# Patient Record
Sex: Male | Born: 1940 | Race: White | Hispanic: No | Marital: Married | Smoking: Former smoker
Health system: Southern US, Community
[De-identification: ages and names within clinical notes are randomized; demographics above are authoritative.]

## PROBLEM LIST (undated history)

## (undated) DIAGNOSIS — R7303 Prediabetes: Secondary | ICD-10-CM

## (undated) DIAGNOSIS — E7800 Pure hypercholesterolemia, unspecified: Secondary | ICD-10-CM

## (undated) DIAGNOSIS — I2510 Atherosclerotic heart disease of native coronary artery without angina pectoris: Secondary | ICD-10-CM

## (undated) DIAGNOSIS — I5020 Unspecified systolic (congestive) heart failure: Secondary | ICD-10-CM

## (undated) DIAGNOSIS — M1990 Unspecified osteoarthritis, unspecified site: Secondary | ICD-10-CM

## (undated) DIAGNOSIS — R1013 Epigastric pain: Secondary | ICD-10-CM

## (undated) DIAGNOSIS — I700 Atherosclerosis of aorta: Secondary | ICD-10-CM

## (undated) DIAGNOSIS — C801 Malignant (primary) neoplasm, unspecified: Secondary | ICD-10-CM

## (undated) DIAGNOSIS — I38 Endocarditis, valve unspecified: Secondary | ICD-10-CM

## (undated) DIAGNOSIS — Z889 Allergy status to unspecified drugs, medicaments and biological substances status: Secondary | ICD-10-CM

## (undated) DIAGNOSIS — N400 Enlarged prostate without lower urinary tract symptoms: Secondary | ICD-10-CM

## (undated) DIAGNOSIS — K219 Gastro-esophageal reflux disease without esophagitis: Secondary | ICD-10-CM

## (undated) DIAGNOSIS — F32A Depression, unspecified: Secondary | ICD-10-CM

## (undated) DIAGNOSIS — Z950 Presence of cardiac pacemaker: Secondary | ICD-10-CM

## (undated) DIAGNOSIS — I10 Essential (primary) hypertension: Secondary | ICD-10-CM

## (undated) DIAGNOSIS — N281 Cyst of kidney, acquired: Secondary | ICD-10-CM

## (undated) DIAGNOSIS — Z86010 Personal history of colonic polyps: Secondary | ICD-10-CM

## (undated) DIAGNOSIS — I252 Old myocardial infarction: Secondary | ICD-10-CM

## (undated) DIAGNOSIS — I499 Cardiac arrhythmia, unspecified: Secondary | ICD-10-CM

## (undated) DIAGNOSIS — Z7901 Long term (current) use of anticoagulants: Secondary | ICD-10-CM

## (undated) DIAGNOSIS — K635 Polyp of colon: Secondary | ICD-10-CM

## (undated) DIAGNOSIS — Z87442 Personal history of urinary calculi: Secondary | ICD-10-CM

## (undated) DIAGNOSIS — I4891 Unspecified atrial fibrillation: Secondary | ICD-10-CM

## (undated) DIAGNOSIS — N529 Male erectile dysfunction, unspecified: Secondary | ICD-10-CM

## (undated) DIAGNOSIS — I2721 Secondary pulmonary arterial hypertension: Secondary | ICD-10-CM

## (undated) HISTORY — PX: OTHER SURGICAL HISTORY: SHX169

## (undated) HISTORY — DX: Polyp of colon: K63.5

## (undated) HISTORY — DX: Unspecified atrial fibrillation: I48.91

---

## 2004-08-20 ENCOUNTER — Ambulatory Visit: Payer: Self-pay | Admitting: Internal Medicine

## 2004-08-20 HISTORY — PX: CARDIAC CATHETERIZATION: SHX172

## 2007-07-01 HISTORY — PX: PROSTATE SURGERY: SHX751

## 2008-10-06 ENCOUNTER — Ambulatory Visit: Payer: Self-pay | Admitting: Family Medicine

## 2008-12-21 ENCOUNTER — Ambulatory Visit: Payer: Self-pay | Admitting: Unknown Physician Specialty

## 2009-11-07 ENCOUNTER — Ambulatory Visit: Payer: Self-pay | Admitting: Urology

## 2009-11-19 ENCOUNTER — Ambulatory Visit: Payer: Self-pay | Admitting: Urology

## 2009-11-23 ENCOUNTER — Observation Stay: Payer: Self-pay | Admitting: Internal Medicine

## 2011-07-01 HISTORY — PX: COLONOSCOPY: SHX174

## 2011-07-13 IMAGING — CT CT HEAD WITHOUT CONTRAST
2 series · 16 of 30 positions shown, 20 images · non-contrast
Comparison: none

REASON FOR EXAM: SYNCOPE
COMMENTS:   May transport without cardiac monitor

PROCEDURE:     CT  - CT HEAD WITHOUT CONTRAST  - November 23, 2009  [DATE]
RESULT:
HISTORY: Syncope.

[Series 2: without · axial · non-contrast · 0.43mm/px · z∈[+623,+748]mm · 13 of 31 slices shown, 17 images]
[im 3/31  brain]
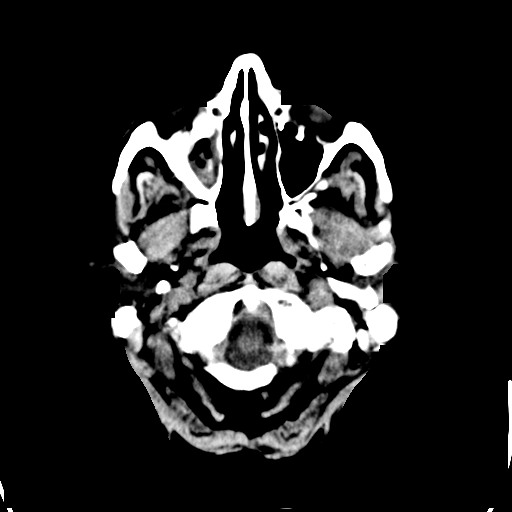
[im 3/31  bone]
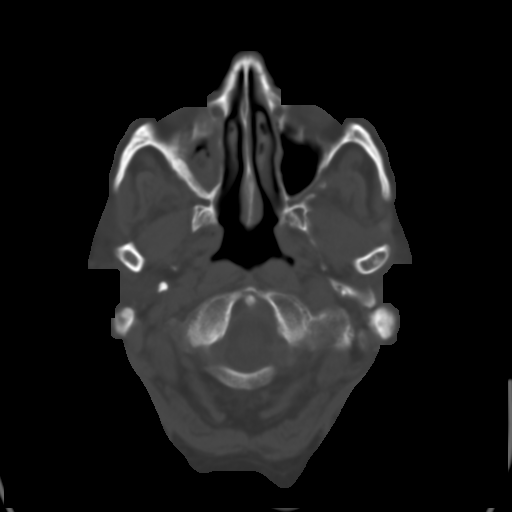
[im 5/31  brain]
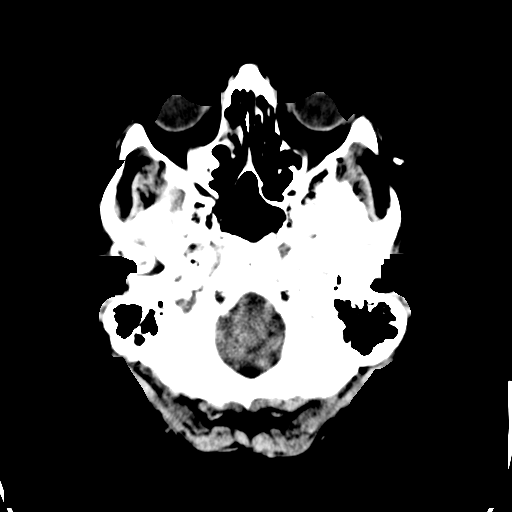
[im 7/31  brain]
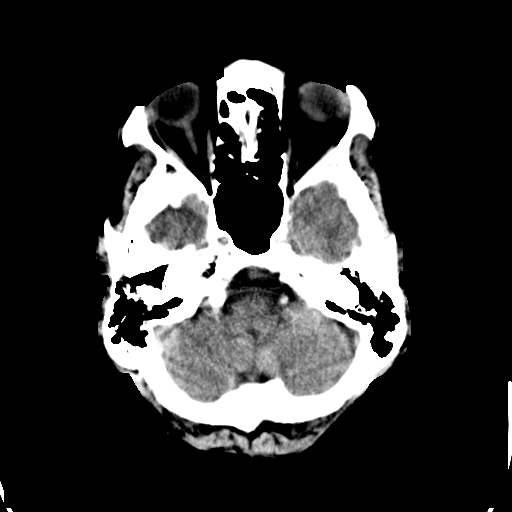
[im 9/31  brain]
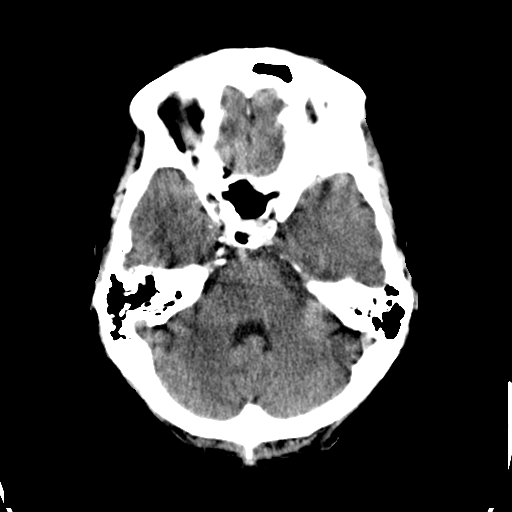
[im 11/31  brain]
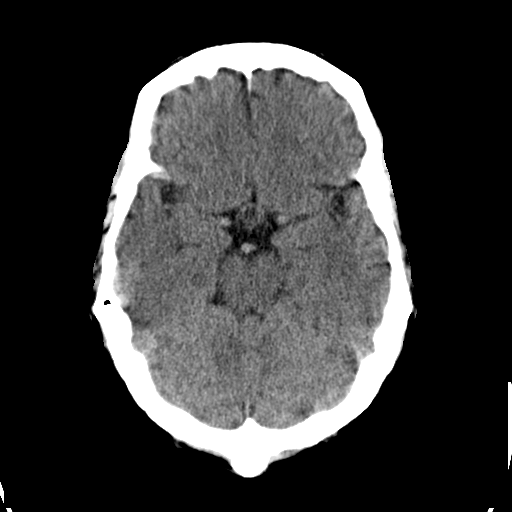
[im 11/31  bone]
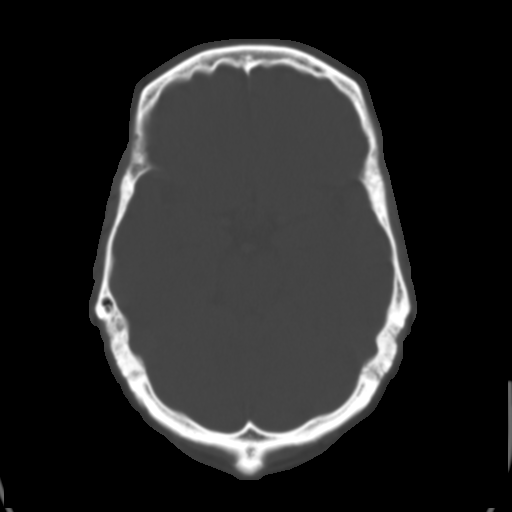
[im 13/31  brain]
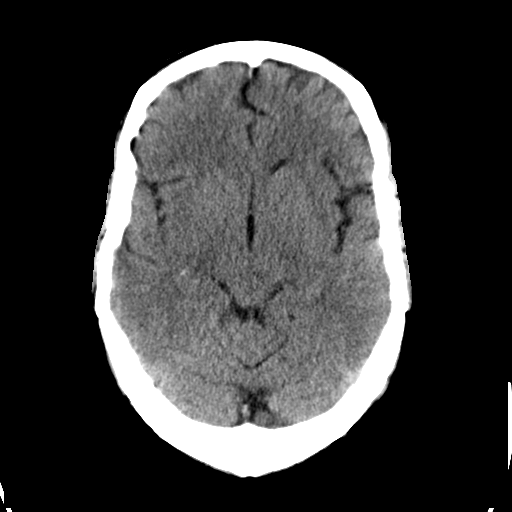
[im 16/31  brain]
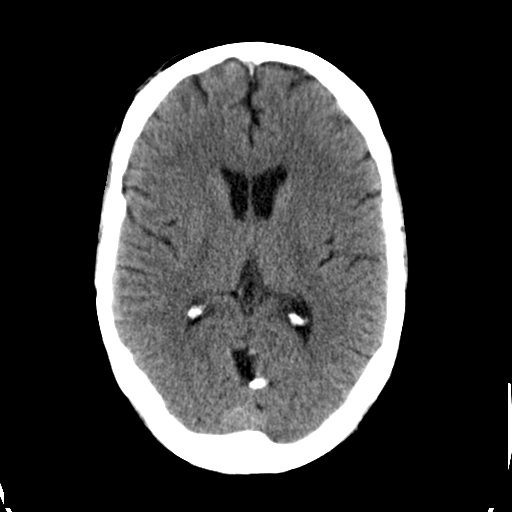
[im 18/31  brain]
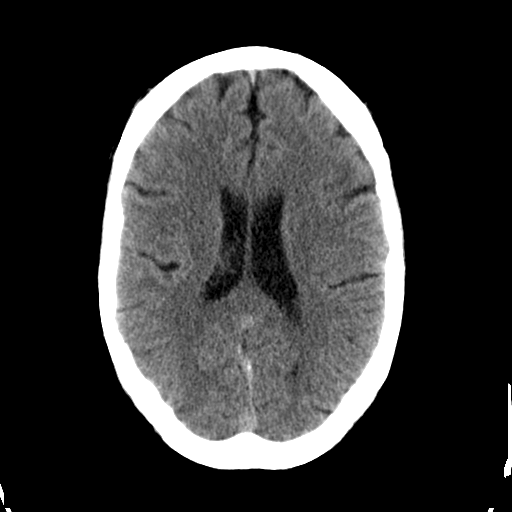
[im 20/31  brain]
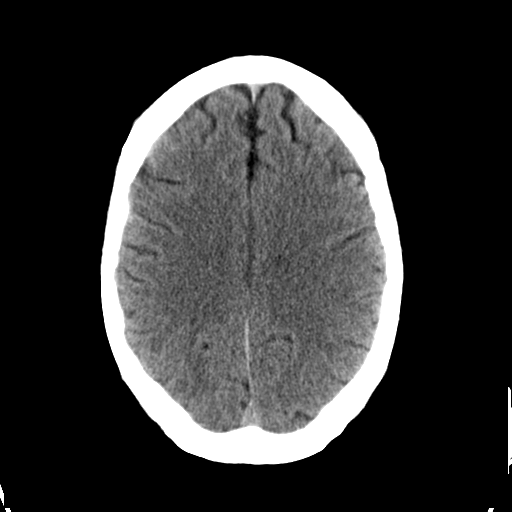
[im 20/31  bone]
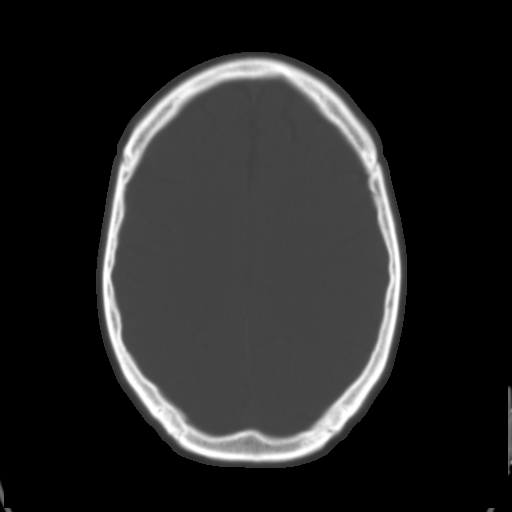
[im 22/31  brain]
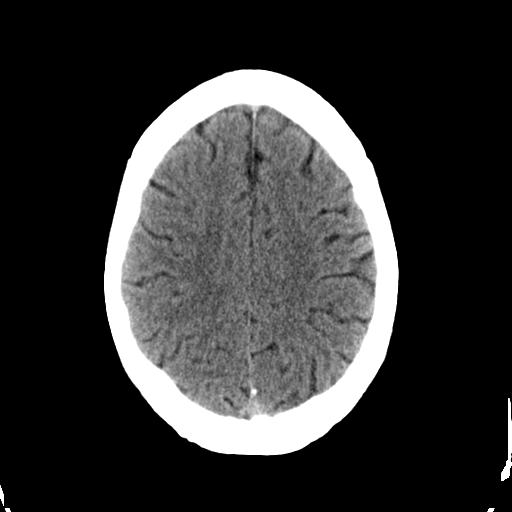
[im 24/31  brain]
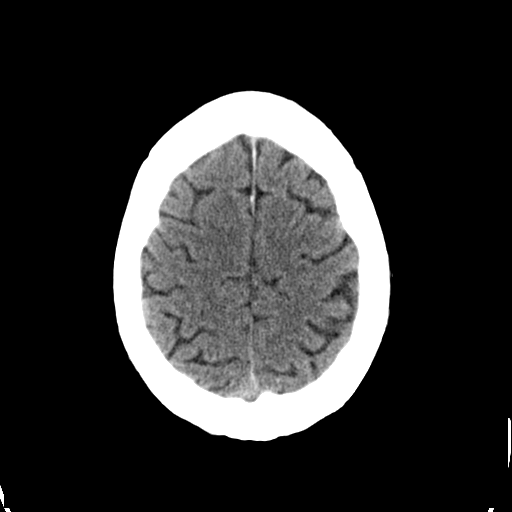
[im 26/31  brain]
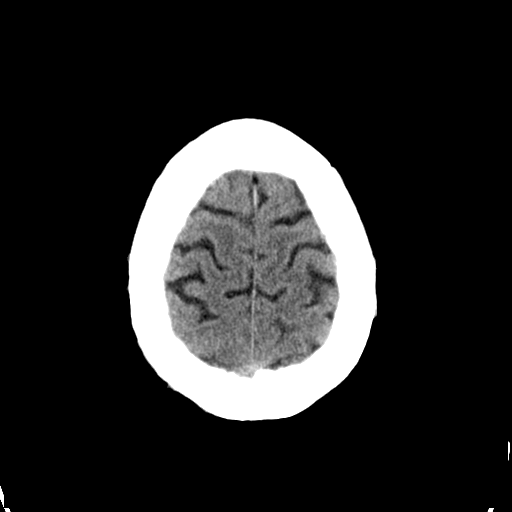
[im 28/31  brain]
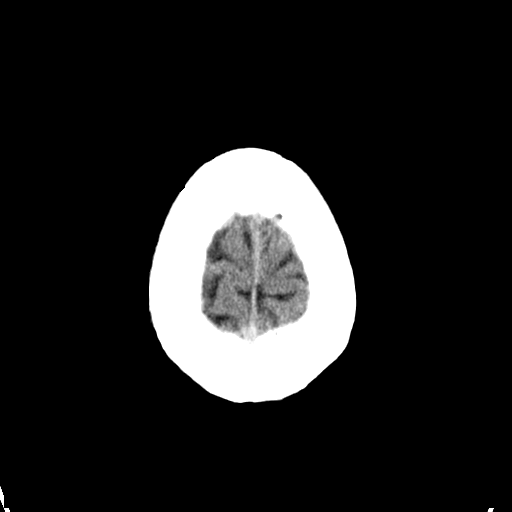
[im 28/31  bone]
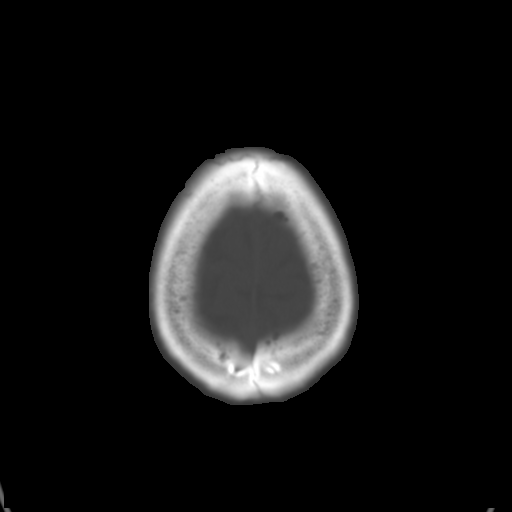

[Series 3: bone · axial · 0.43mm/px · z∈[+623,+663]mm · 3 of 31 slices shown]
[im 3/31  bone]
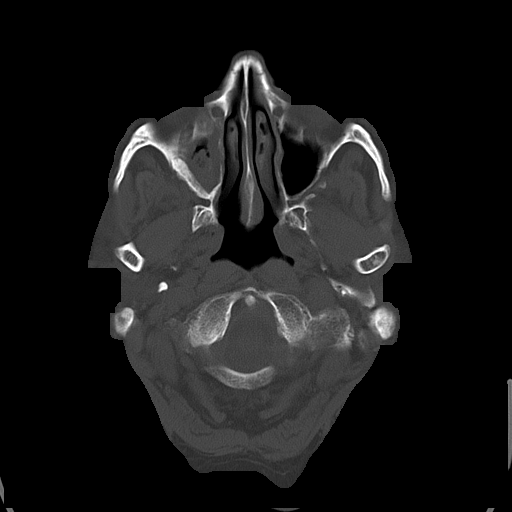
[im 7/31  bone]
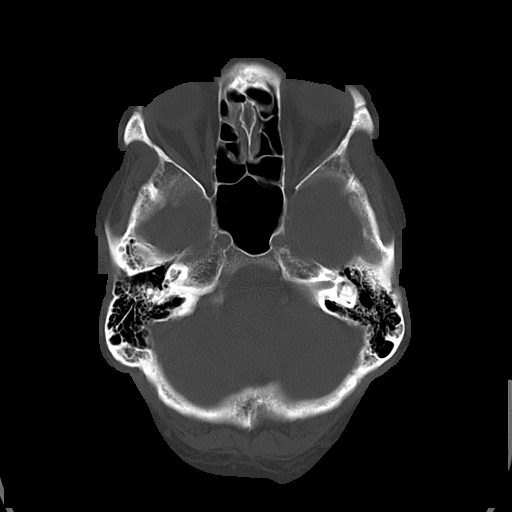
[im 11/31  bone]
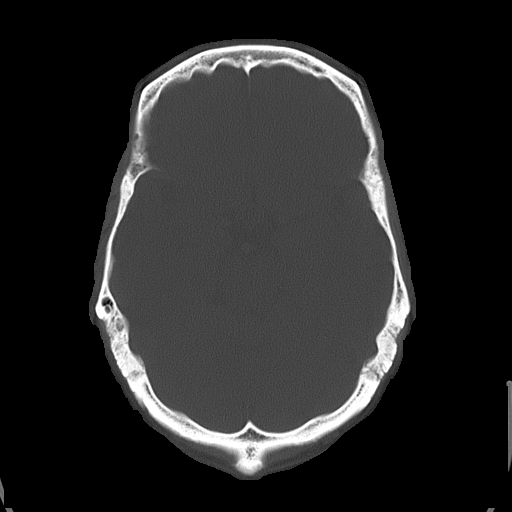

[16 of 30 positions shown; findings below may reference images not displayed]

FINDINGS: Standard nonenhanced head CT was obtained. No prior study is
available for comparison. No mass lesion is noted. There is no
hydrocephalus. No evidence of hemorrhage. Right maxillary sinus mucosal
thickening is noted consistent with sinusitis. No acute bony abnormality is
identified.
IMPRESSION: Right maxillary sinusitis. Otherwise, normal exam.

## 2012-06-14 ENCOUNTER — Ambulatory Visit: Payer: Self-pay | Admitting: Family Medicine

## 2012-09-13 ENCOUNTER — Ambulatory Visit: Payer: Self-pay | Admitting: Cardiology

## 2012-09-13 HISTORY — PX: CARDIAC CATHETERIZATION: SHX172

## 2013-05-24 ENCOUNTER — Ambulatory Visit: Payer: Self-pay | Admitting: Family Medicine

## 2014-02-20 ENCOUNTER — Ambulatory Visit: Payer: Self-pay | Admitting: Unknown Physician Specialty

## 2014-02-22 LAB — PATHOLOGY REPORT

## 2014-04-20 ENCOUNTER — Ambulatory Visit: Payer: Self-pay | Admitting: Family Medicine

## 2015-01-09 ENCOUNTER — Other Ambulatory Visit: Payer: Self-pay

## 2015-01-09 ENCOUNTER — Encounter: Payer: Self-pay | Admitting: Emergency Medicine

## 2015-01-09 ENCOUNTER — Emergency Department
Admission: EM | Admit: 2015-01-09 | Discharge: 2015-01-09 | Disposition: A | Discharge status: Home / Self Care | Payer: Medicare Other | Attending: Emergency Medicine | Admitting: Emergency Medicine

## 2015-01-09 ENCOUNTER — Emergency Department: Payer: Medicare Other

## 2015-01-09 DIAGNOSIS — I252 Old myocardial infarction: Secondary | ICD-10-CM | POA: Diagnosis not present

## 2015-01-09 DIAGNOSIS — E119 Type 2 diabetes mellitus without complications: Secondary | ICD-10-CM | POA: Diagnosis not present

## 2015-01-09 DIAGNOSIS — R079 Chest pain, unspecified: Secondary | ICD-10-CM | POA: Diagnosis present

## 2015-01-09 DIAGNOSIS — I10 Essential (primary) hypertension: Secondary | ICD-10-CM | POA: Insufficient documentation

## 2015-01-09 HISTORY — DX: Essential (primary) hypertension: I10

## 2015-01-09 HISTORY — DX: Old myocardial infarction: I25.2

## 2015-01-09 HISTORY — DX: Benign prostatic hyperplasia without lower urinary tract symptoms: N40.0

## 2015-01-09 LAB — BASIC METABOLIC PANEL
ANION GAP: 6 (ref 5–15)
BUN: 13 mg/dL (ref 6–20)
CHLORIDE: 107 mmol/L (ref 101–111)
CO2: 29 mmol/L (ref 22–32)
CREATININE: 0.77 mg/dL (ref 0.61–1.24)
Calcium: 8.9 mg/dL (ref 8.9–10.3)
GFR calc Af Amer: >60 mL/min (ref >60)
GFR calc non Af Amer: >60 mL/min (ref >60)
GLUCOSE: 102 mg/dL — AB (ref 65–99)
Potassium: 3.6 mmol/L (ref 3.5–5.1)
Sodium: 142 mmol/L (ref 135–145)

## 2015-01-09 LAB — CBC
HCT: 46.4 % (ref 40.0–52.0)
HEMOGLOBIN: 15.8 g/dL (ref 13.0–18.0)
MCH: 30.3 pg (ref 26.0–34.0)
MCHC: 34 g/dL (ref 32.0–36.0)
MCV: 89.3 fL (ref 80.0–100.0)
Platelets: 163 10*3/uL (ref 150–440)
RBC: 5.2 MIL/uL (ref 4.40–5.90)
RDW: 13.7 % (ref 11.5–14.5)
WBC: 7.2 10*3/uL (ref 3.8–10.6)

## 2015-01-09 LAB — TROPONIN I: TROPONIN I: 0.03 ng/mL (ref <0.031)

## 2015-01-09 NOTE — Discharge Instructions (Signed)
Please seek medical attention for any high fevers, chest pain, shortness of breath, change in behavior, persistent vomiting, bloody stool or any other new or concerning symptoms. ° °Chest Pain (Nonspecific) °It is often hard to give a specific diagnosis for the cause of chest pain. There is always a chance that your pain could be related to something serious, such as a heart attack or a blood clot in the lungs. You need to follow up with your health care provider for further evaluation. °CAUSES  °· Heartburn. °· Pneumonia or bronchitis. °· Anxiety or stress. °· Inflammation around your heart (pericarditis) or lung (pleuritis or pleurisy). °· A blood clot in the lung. °· A collapsed lung (pneumothorax). It can develop suddenly on its own (spontaneous pneumothorax) or from trauma to the chest. °· Shingles infection (herpes zoster virus). °The chest wall is composed of bones, muscles, and cartilage. Any of these can be the source of the pain. °· The bones can be bruised by injury. °· The muscles or cartilage can be strained by coughing or overwork. °· The cartilage can be affected by inflammation and become sore (costochondritis). °DIAGNOSIS  °Lab tests or other studies may be needed to find the cause of your pain. Your health care provider may have you take a test called an ambulatory electrocardiogram (ECG). An ECG records your heartbeat patterns over a 24-hour period. You may also have other tests, such as: °· Transthoracic echocardiogram (TTE). During echocardiography, sound waves are used to evaluate how blood flows through your heart. °· Transesophageal echocardiogram (TEE). °· Cardiac monitoring. This allows your health care provider to monitor your heart rate and rhythm in real time. °· Holter monitor. This is a portable device that records your heartbeat and can help diagnose heart arrhythmias. It allows your health care provider to track your heart activity for several days, if needed. °· Stress tests by  exercise or by giving medicine that makes the heart beat faster. °TREATMENT  °· Treatment depends on what may be causing your chest pain. Treatment may include: °¨ Acid blockers for heartburn. °¨ Anti-inflammatory medicine. °¨ Pain medicine for inflammatory conditions. °¨ Antibiotics if an infection is present. °· You may be advised to change lifestyle habits. This includes stopping smoking and avoiding alcohol, caffeine, and chocolate. °· You may be advised to keep your head raised (elevated) when sleeping. This reduces the chance of acid going backward from your stomach into your esophagus. °Most of the time, nonspecific chest pain will improve within 2-3 days with rest and mild pain medicine.  °HOME CARE INSTRUCTIONS  °· If antibiotics were prescribed, take them as directed. Finish them even if you start to feel better. °· For the next few days, avoid physical activities that bring on chest pain. Continue physical activities as directed. °· Do not use any tobacco products, including cigarettes, chewing tobacco, or electronic cigarettes. °· Avoid drinking alcohol. °· Only take medicine as directed by your health care provider. °· Follow your health care provider's suggestions for further testing if your chest pain does not go away. °· Keep any follow-up appointments you made. If you do not go to an appointment, you could develop lasting (chronic) problems with pain. If there is any problem keeping an appointment, call to reschedule. °SEEK MEDICAL CARE IF:  °· Your chest pain does not go away, even after treatment. °· You have a rash with blisters on your chest. °· You have a fever. °SEEK IMMEDIATE MEDICAL CARE IF:  °· You have increased chest   pain or pain that spreads to your arm, neck, jaw, back, or abdomen. °· You have shortness of breath. °· You have an increasing cough, or you cough up blood. °· You have severe back or abdominal pain. °· You feel nauseous or vomit. °· You have severe weakness. °· You  faint. °· You have chills. °This is an emergency. Do not wait to see if the pain will go away. Get medical help at once. Call your local emergency services (911 in [place] Do not drive yourself to the hospital. °MAKE SURE YOU:  °· Understand these instructions. °· Will watch your condition. °· Will get help right away if you are not doing well or get worse. °Document Released: 03/26/2005 Document Revised: 06/21/2013 Document Reviewed: 01/20/2008 °ExitCare® Patient Information ©2015 [employer]. This information is not intended to replace advice given to you by your health care provider. Make sure you discuss any questions you have with your health care provider. ° °

## 2015-01-09 NOTE — ED Provider Notes (Signed)
[facility] Emergency Department Provider Note  ____________________________________________  Time seen: 1330  I have reviewed the triage vital signs and the nursing notes.   HISTORY  Chief Complaint Chest Pain   History limited by: Not Limited   HPI Kevin Marks is a 74 y.o. male who presents to the emergency department today because of concerns for chest pain. The patient has now had 2 episodes of chest pain. The first episode happened roughly 10 days ago when he was walking up steps. He states he had left-sided chest pain that radiated into his shoulder. It is shooting. It was accompanied with shortness of breath. He then felt better until a couple of days ago when he had a second episode of pain. This occurred while he was on his riding mower. He denies any episodes of pain today. He went to his doctor's office where they sent him over here. States last catheterization was roughly 15 years ago.     Past Medical History  Diagnosis Date  . Diabetes mellitus without complication   . Hypertension   . MI, old   . BPH (benign prostatic hyperplasia)     There are no active problems to display for this patient.   Past Surgical History  Procedure Laterality Date  . Prostate surgery      No current outpatient prescriptions on file.  Allergies Review of patient's allergies indicates no known allergies.  No family history on file.  Social History History  Substance Use Topics  . Smoking status: Never Smoker   . Smokeless tobacco: Not on file  . Alcohol Use: No    Review of Systems  Constitutional: Negative for fever. Cardiovascular: Positive for chest pain. Respiratory: Positive for shortness of breath. Gastrointestinal: Negative for abdominal pain, vomiting and diarrhea. Genitourinary: Negative for dysuria. Musculoskeletal: Negative for back pain. Skin: Negative for rash. Neurological: Negative for headaches, focal weakness or  numbness.   10-point ROS otherwise negative.  ____________________________________________   PHYSICAL EXAM:  VITAL SIGNS: ED Triage Vitals  Enc Vitals Group     BP 01/09/15 1200 178/95 mmHg     Pulse Rate 01/09/15 1200 51     Resp 01/09/15 1200 17     Temp --      Temp src --      SpO2 01/09/15 1200 99 %     Weight --      Height --      Head Cir --      Peak Flow --      Pain Score 01/09/15 1142 2   Constitutional: Alert and oriented. Well appearing and in no distress. Eyes: Conjunctivae are normal. PERRL. Normal extraocular movements. ENT   Head: Normocephalic and atraumatic.   Nose: No congestion/rhinnorhea.   Mouth/Throat: Mucous membranes are moist.   Neck: No stridor. Hematological/Lymphatic/Immunilogical: No cervical lymphadenopathy. Cardiovascular: Normal rate, regular rhythm.  No murmurs, rubs, or gallops. Respiratory: Normal respiratory effort without tachypnea nor retractions. Breath sounds are clear and equal bilaterally. No wheezes/rales/rhonchi. Gastrointestinal: Soft and nontender. No distention. There is no CVA tenderness. Genitourinary: Deferred Musculoskeletal: Normal range of motion in all extremities. No joint effusions.  No lower extremity tenderness nor edema. Neurologic:  Normal speech and language. No gross focal neurologic deficits are appreciated. Speech is normal.  Skin:  Skin is warm, dry and intact. No rash noted. Psychiatric: Mood and affect are normal. Speech and behavior are normal. Patient exhibits appropriate insight and judgment.  ____________________________________________    LABS (pertinent  positives/negatives)  Labs Reviewed  BASIC METABOLIC PANEL - Abnormal; Notable for the following:    Glucose, Bld 102 (*)    All other components within normal limits  CBC  TROPONIN I    ____________________________________________   EKG  I, Nance Pear, attending physician, personally viewed and interpreted this  EKG  EKG Time: 1143 Rate: 55 Rhythm: Sinus bradycardia Axis: Left axis deviation Intervals: qtc 430 QRS: Narrow ST changes: No ST elevation    ____________________________________________    RADIOLOGY  CXR IMPRESSION: No active disease.  ____________________________________________   PROCEDURES  Procedure(s) performed: None  Critical Care performed: No  ____________________________________________   INITIAL IMPRESSION / ASSESSMENT AND PLAN / ED COURSE  Pertinent labs & imaging results that were available during my care of the patient were reviewed by me and considered in my medical decision making (see chart for details).  Patient presents to the ED from [place] office because of concerns for episodes of chest pain. Patient did not have any pain today. Patient's workup including troponin is negative. I do believe patient was having significant ACS is troponin should be elevated at this point. Patient does have some some risk factors for ACS. Discussed with patient importance of following up with cardiology for further testing. Additionally chest x-ray was performed and did not show any concerning findings such as pneumothorax or pneumonia.  ____________________________________________   FINAL CLINICAL IMPRESSION(S) / ED DIAGNOSES  Final diagnoses:  Chest pain, unspecified chest pain type     Nance Pear, MD 01/09/15 1356

## 2015-01-09 NOTE — ED Notes (Signed)
Patient transported to CT 

## 2015-01-09 NOTE — ED Notes (Signed)
Pt reports left sided chest pain that radiates to left shoulder since Saturday. Pt reports pain as shooting, denies any recent injury. Pt reports shortness of breath with episodes.

## 2016-05-01 ENCOUNTER — Encounter: Payer: Self-pay | Admitting: *Deleted

## 2016-05-14 ENCOUNTER — Encounter: Payer: Self-pay | Admitting: General Surgery

## 2016-05-14 ENCOUNTER — Ambulatory Visit (INDEPENDENT_AMBULATORY_CARE_PROVIDER_SITE_OTHER): Payer: Medicare Other | Admitting: General Surgery

## 2016-05-14 VITALS — BP 168/82 | HR 58 | Resp 16 | Ht 70.5 in | Wt 181.0 lb

## 2016-05-14 DIAGNOSIS — K429 Umbilical hernia without obstruction or gangrene: Secondary | ICD-10-CM

## 2016-05-14 NOTE — Patient Instructions (Addendum)
Recommend orthopedic physician regarding Dupuytren's contracture right hand. The patient is aware to call back for any questions, changes in hernia or concerns.   Hernia, Adult A hernia is the bulging of an organ or tissue through a weak spot in the muscles of the abdomen (abdominal wall). Hernias develop most often near the navel or groin. There are many kinds of hernias. Common kinds include:  Femoral hernia. This kind of hernia develops under the groin in the upper thigh area.  Inguinal hernia. This kind of hernia develops in the groin or scrotum.  Umbilical hernia. This kind of hernia develops near the navel.  Hiatal hernia. This kind of hernia causes part of the stomach to be pushed up into the chest.  Incisional hernia. This kind of hernia bulges through a scar from an abdominal surgery. What are the causes? This condition may be caused by:  Heavy lifting.  Coughing over a long period of time.  Straining to have a bowel movement.  An incision made during an abdominal surgery.  A birth defect (congenital defect).  Excess weight or obesity.  Smoking.  Poor nutrition.  Cystic fibrosis.  Excess fluid in the abdomen.  Undescended testicles. What are the signs or symptoms? Symptoms of a hernia include:  A lump on the abdomen. This is the first sign of a hernia. The lump may become more obvious with standing, straining, or coughing. It may get bigger over time if it is not treated or if the condition causing it is not treated.  Pain. A hernia is usually painless, but it may become painful over time if treatment is delayed. The pain is usually dull and may get worse with standing or lifting heavy objects. Sometimes a hernia gets tightly squeezed in the weak spot (strangulated) or stuck there (incarcerated) and causes additional symptoms. These symptoms may include:  Vomiting.  Nausea.  Constipation.  Irritability. How is this diagnosed? A hernia may be  diagnosed with:  A physical exam. During the exam your health care provider may ask you to cough or to make a specific movement, because a hernia is usually more visible when you move.  Imaging tests. These can include:  X-rays.  Ultrasound.  CT scan. How is this treated? A hernia that is small and painless may not need to be treated. A hernia that is large or painful may be treated with surgery. Inguinal hernias may be treated with surgery to prevent incarceration or strangulation. Strangulated hernias are always treated with surgery, because lack of blood to the trapped organ or tissue can cause it to die. Surgery to treat a hernia involves pushing the bulge back into place and repairing the weak part of the abdomen. Follow these instructions at home:  Avoid straining.  Do not lift anything heavier than 10 lb (4.5 kg).  Lift with your leg muscles, not your back muscles. This helps avoid strain.  When coughing, try to cough gently.  Prevent constipation. Constipation leads to straining with bowel movements, which can make a hernia worse or cause a hernia repair to break down. You can prevent constipation by:  Eating a high-fiber diet that includes plenty of fruits and vegetables.  Drinking enough fluids to keep your urine clear or pale yellow. Aim to drink 6-8 glasses of water per day.  Using a stool softener as directed by your health care provider.  Lose weight, if you are overweight.  Do not use any tobacco products, including cigarettes, chewing tobacco, or electronic cigarettes. If  you need help quitting, ask your health care provider.  Keep all follow-up visits as directed by your health care provider. This is important. Your health care provider may need to monitor your condition. Contact a health care provider if:  You have swelling, redness, and pain in the affected area.  Your bowel habits change. Get help right away if:  You have a fever.  You have abdominal  pain that is getting worse.  You feel nauseous or you vomit.  You cannot push the hernia back in place by gently pressing on it while you are lying down.  The hernia:  Changes in shape or size.  Is stuck outside the abdomen.  Becomes discolored.  Feels hard or tender. This information is not intended to replace advice given to you by your health care provider. Make sure you discuss any questions you have with your health care provider. Document Released: 06/16/2005 Document Revised: 11/14/2015 Document Reviewed: 04/26/2014 Elsevier Interactive Patient Education  2017 [employer].

## 2016-05-14 NOTE — Progress Notes (Signed)
Dictation #1 [id number]  [id number] Dictation #2 [id number]  [id number] Patient ID: Kevin Marks, male   DOB: 07-02-1940, 75 y.o.   MRN: [id number]  Chief Complaint  Patient presents with  . Hernia    HPI Kevin Marks is a 75 y.o. male.  Patient here today for an evaluation of a hernia.  States that they were born with the bulge. He states the doctor thinks its getting larger.  It does not seem to be causing some abdominal pain.  No nausea, vomiting, constipation or diarrhea noted. Bowels move regular.  He is here with his daughter, Kevin Marks  I personally reviewed the patient's history.  HPI  Past Medical History:  Diagnosis Date  . BPH (benign prostatic hyperplasia)   . Colon polyp   . Diabetes mellitus without complication ([place])   . Hypertension   . MI, old     Past Surgical History:  Procedure Laterality Date  . COLONOSCOPY  2013   Dr Vira Agar  . PROSTATE SURGERY  2009    Family History  Problem Relation Age of Onset  . Alzheimer's disease Mother   . Cancer Father     Social History Social History  Substance Use Topics  . Smoking status: Never Smoker  . Smokeless tobacco: Never Used  . Alcohol use No    No Known Allergies  Current Outpatient Prescriptions  Medication Sig Dispense Refill  . aspirin EC 81 MG tablet Take by mouth.    Marland Kitchen atorvastatin (LIPITOR) 40 MG tablet TAKE 1 TABLET BY MOUTH EVERY DAY    . EQL NATURAL ZINC 50 MG TABS Take by mouth.    . hydrochlorothiazide (HYDRODIURIL) 12.5 MG tablet     . losartan (COZAAR) 100 MG tablet     . metoprolol (LOPRESSOR) 50 MG tablet TAKE 1 TABLET BY MOUTH TWICE DAILY    . Omega-3 Fatty Acids (FISH OIL) 1000 MG CPDR Take by mouth daily.    Marland Kitchen omeprazole (PRILOSEC) 20 MG capsule Take 20 mg by mouth daily.     Marland Kitchen SILDENAFIL CITRATE PO Take by mouth every 30 (thirty) days.     No current facility-administered medications for this visit.     Review of Systems Review of Systems   Constitutional: Negative.   Respiratory: Negative.   Cardiovascular: Negative.   Gastrointestinal: Negative.     Blood pressure (!) 168/82, pulse (!) 58, resp. rate 16, height 5' 10.5" (1.791 m), weight 181 lb (82.1 kg), SpO2 95 %.  Physical Exam Physical Exam  Constitutional: He is oriented to person, place, and time. He appears well-developed and well-nourished.  HENT:  Mouth/Throat: Oropharynx is clear and moist.  Eyes: Conjunctivae are normal. No scleral icterus.  Neck: Neck supple.  Cardiovascular: Normal rate, regular rhythm and normal heart sounds.   No lower leg edema noted.  Pulmonary/Chest: Effort normal and breath sounds normal.  Abdominal: Soft. Normal appearance and bowel sounds are normal. There is no tenderness. A hernia is present.    Small umbilical hernia present.   Musculoskeletal:  Dupuytren's contracture left hand.  Lymphadenopathy:    He has no cervical adenopathy.  Neurological: He is alert and oriented to person, place, and time.  Skin: Skin is warm and dry.  Left abdominal lipoma.  Psychiatric: His behavior is normal.       Assessment    Minimally symptomatic umbilical hernia.  Asymptomatic left abdominal wall lipoma.     Plan    The patient is essentially asymptomatic from the umbilical  hernia. Caryl Pina defect is small making the likelihood of obstruction extremely rare. Patient was encouraged to report if he had sudden pain, nausea/vomiting..     Follow up as needed. Recommend orthopedic evaluation with his regular orthopedist, Kevin Marks, M.D. regarding Dupuytren's contracture. The patient is aware to call back for any questions, changes in hernia or concerns.   This information has been scribed by Karie Fetch RN, BSN,BC.   Robert Bellow 05/14/2016, 8:19 PM

## 2016-05-27 ENCOUNTER — Encounter: Payer: Self-pay | Admitting: General Surgery

## 2016-05-30 ENCOUNTER — Other Ambulatory Visit: Payer: Self-pay | Admitting: Unknown Physician Specialty

## 2016-05-30 DIAGNOSIS — M1712 Unilateral primary osteoarthritis, left knee: Secondary | ICD-10-CM

## 2016-06-10 ENCOUNTER — Other Ambulatory Visit: Payer: Self-pay | Admitting: Orthopedic Surgery

## 2016-06-10 ENCOUNTER — Ambulatory Visit
Admission: RE | Admit: 2016-06-10 | Discharge: 2016-06-10 | Disposition: A | Discharge status: Home / Self Care | Payer: Medicare Other | Source: Ambulatory Visit | Attending: Unknown Physician Specialty | Admitting: Unknown Physician Specialty

## 2016-06-10 ENCOUNTER — Ambulatory Visit
Admission: RE | Admit: 2016-06-10 | Discharge: 2016-06-10 | Disposition: A | Discharge status: Home / Self Care | Payer: Medicare Other | Source: Ambulatory Visit | Attending: Orthopedic Surgery | Admitting: Orthopedic Surgery

## 2016-06-10 DIAGNOSIS — X58XXXA Exposure to other specified factors, initial encounter: Secondary | ICD-10-CM | POA: Insufficient documentation

## 2016-06-10 DIAGNOSIS — M1712 Unilateral primary osteoarthritis, left knee: Secondary | ICD-10-CM | POA: Insufficient documentation

## 2016-06-10 DIAGNOSIS — T1590XA Foreign body on external eye, part unspecified, unspecified eye, initial encounter: Secondary | ICD-10-CM

## 2016-06-10 DIAGNOSIS — S83242A Other tear of medial meniscus, current injury, left knee, initial encounter: Secondary | ICD-10-CM | POA: Insufficient documentation

## 2016-06-10 DIAGNOSIS — M7122 Synovial cyst of popliteal space [Baker], left knee: Secondary | ICD-10-CM | POA: Insufficient documentation

## 2016-06-10 DIAGNOSIS — Z0100 Encounter for examination of eyes and vision without abnormal findings: Secondary | ICD-10-CM | POA: Insufficient documentation

## 2016-06-30 HISTORY — PX: JOINT REPLACEMENT: SHX530

## 2016-11-11 ENCOUNTER — Other Ambulatory Visit (INDEPENDENT_AMBULATORY_CARE_PROVIDER_SITE_OTHER): Payer: Medicare Other

## 2016-11-11 ENCOUNTER — Other Ambulatory Visit (INDEPENDENT_AMBULATORY_CARE_PROVIDER_SITE_OTHER): Payer: Self-pay | Admitting: Unknown Physician Specialty

## 2016-11-11 DIAGNOSIS — M7989 Other specified soft tissue disorders: Secondary | ICD-10-CM

## 2016-11-11 DIAGNOSIS — M79605 Pain in left leg: Secondary | ICD-10-CM | POA: Diagnosis not present

## 2017-10-13 ENCOUNTER — Other Ambulatory Visit: Payer: Self-pay | Admitting: Family Medicine

## 2017-10-13 ENCOUNTER — Other Ambulatory Visit: Payer: Self-pay | Admitting: Sports Medicine

## 2017-10-13 DIAGNOSIS — N442 Benign cyst of testis: Secondary | ICD-10-CM

## 2017-10-13 DIAGNOSIS — N4400 Torsion of testis, unspecified: Secondary | ICD-10-CM

## 2017-10-19 ENCOUNTER — Ambulatory Visit
Admission: RE | Admit: 2017-10-19 | Discharge: 2017-10-19 | Disposition: A | Discharge status: Home / Self Care | Payer: Medicare HMO | Source: Ambulatory Visit | Attending: Family Medicine | Admitting: Family Medicine

## 2017-10-19 DIAGNOSIS — N442 Benign cyst of testis: Secondary | ICD-10-CM | POA: Diagnosis present

## 2017-10-19 DIAGNOSIS — N433 Hydrocele, unspecified: Secondary | ICD-10-CM | POA: Insufficient documentation

## 2018-04-02 ENCOUNTER — Encounter: Payer: Self-pay | Admitting: *Deleted

## 2018-04-05 ENCOUNTER — Ambulatory Visit
Admission: RE | Admit: 2018-04-05 | Discharge: 2018-04-05 | Disposition: A | Discharge status: Home / Self Care | Payer: Medicare HMO | Source: Ambulatory Visit | Attending: Unknown Physician Specialty | Admitting: Unknown Physician Specialty

## 2018-04-05 ENCOUNTER — Ambulatory Visit: Payer: Medicare HMO | Admitting: Anesthesiology

## 2018-04-05 ENCOUNTER — Encounter: Payer: Self-pay | Admitting: *Deleted

## 2018-04-05 ENCOUNTER — Encounter
Admission: RE | Disposition: A | Discharge status: Home / Self Care | Payer: Self-pay | Source: Ambulatory Visit | Attending: Unknown Physician Specialty

## 2018-04-05 DIAGNOSIS — K640 First degree hemorrhoids: Secondary | ICD-10-CM | POA: Insufficient documentation

## 2018-04-05 DIAGNOSIS — I38 Endocarditis, valve unspecified: Secondary | ICD-10-CM | POA: Insufficient documentation

## 2018-04-05 DIAGNOSIS — Z820 Family history of epilepsy and other diseases of the nervous system: Secondary | ICD-10-CM | POA: Insufficient documentation

## 2018-04-05 DIAGNOSIS — N400 Enlarged prostate without lower urinary tract symptoms: Secondary | ICD-10-CM | POA: Insufficient documentation

## 2018-04-05 DIAGNOSIS — I10 Essential (primary) hypertension: Secondary | ICD-10-CM | POA: Diagnosis not present

## 2018-04-05 DIAGNOSIS — K219 Gastro-esophageal reflux disease without esophagitis: Secondary | ICD-10-CM | POA: Diagnosis not present

## 2018-04-05 DIAGNOSIS — Z86010 Personal history of colonic polyps: Secondary | ICD-10-CM | POA: Diagnosis not present

## 2018-04-05 DIAGNOSIS — K5730 Diverticulosis of large intestine without perforation or abscess without bleeding: Secondary | ICD-10-CM | POA: Diagnosis not present

## 2018-04-05 DIAGNOSIS — Z809 Family history of malignant neoplasm, unspecified: Secondary | ICD-10-CM | POA: Diagnosis not present

## 2018-04-05 DIAGNOSIS — Z79899 Other long term (current) drug therapy: Secondary | ICD-10-CM | POA: Diagnosis not present

## 2018-04-05 DIAGNOSIS — Z9660 Presence of unspecified orthopedic joint implant: Secondary | ICD-10-CM | POA: Diagnosis not present

## 2018-04-05 DIAGNOSIS — R1013 Epigastric pain: Secondary | ICD-10-CM | POA: Diagnosis not present

## 2018-04-05 DIAGNOSIS — Z1211 Encounter for screening for malignant neoplasm of colon: Secondary | ICD-10-CM | POA: Insufficient documentation

## 2018-04-05 DIAGNOSIS — Z7982 Long term (current) use of aspirin: Secondary | ICD-10-CM | POA: Diagnosis not present

## 2018-04-05 DIAGNOSIS — Z87891 Personal history of nicotine dependence: Secondary | ICD-10-CM | POA: Insufficient documentation

## 2018-04-05 DIAGNOSIS — E7800 Pure hypercholesterolemia, unspecified: Secondary | ICD-10-CM | POA: Diagnosis not present

## 2018-04-05 DIAGNOSIS — M1990 Unspecified osteoarthritis, unspecified site: Secondary | ICD-10-CM | POA: Insufficient documentation

## 2018-04-05 DIAGNOSIS — D123 Benign neoplasm of transverse colon: Secondary | ICD-10-CM | POA: Insufficient documentation

## 2018-04-05 DIAGNOSIS — I252 Old myocardial infarction: Secondary | ICD-10-CM | POA: Diagnosis not present

## 2018-04-05 HISTORY — PX: COLONOSCOPY WITH PROPOFOL: SHX5780

## 2018-04-05 HISTORY — DX: Gastro-esophageal reflux disease without esophagitis: K21.9

## 2018-04-05 HISTORY — DX: Allergy status to unspecified drugs, medicaments and biological substances: Z88.9

## 2018-04-05 HISTORY — DX: Male erectile dysfunction, unspecified: N52.9

## 2018-04-05 HISTORY — DX: Unspecified osteoarthritis, unspecified site: M19.90

## 2018-04-05 HISTORY — DX: Personal history of colonic polyps: Z86.010

## 2018-04-05 HISTORY — DX: Pure hypercholesterolemia, unspecified: E78.00

## 2018-04-05 HISTORY — DX: Epigastric pain: R10.13

## 2018-04-05 SURGERY — COLONOSCOPY WITH PROPOFOL
Anesthesia: General

## 2018-04-05 MED ORDER — METOPROLOL TARTRATE 5 MG/5ML IV SOLN
INTRAVENOUS | Status: DC | PRN
  Administered 2018-04-05 (×2): 1 mg via INTRAVENOUS

## 2018-04-05 MED ORDER — FENTANYL CITRATE (PF) 100 MCG/2ML IJ SOLN
INTRAMUSCULAR | Status: AC
  Filled 2018-04-05: qty 2

## 2018-04-05 MED ORDER — LIDOCAINE HCL (PF) 2 % IJ SOLN
INTRAMUSCULAR | Status: DC | PRN
  Administered 2018-04-05: 80 mg

## 2018-04-05 MED ORDER — PIPERACILLIN-TAZOBACTAM 3.375 G IVPB
INTRAVENOUS | Status: AC
  Administered 2018-04-05: 3.375 g via INTRAVENOUS
  Filled 2018-04-05: qty 50

## 2018-04-05 MED ORDER — MIDAZOLAM HCL 2 MG/2ML IJ SOLN
INTRAMUSCULAR | Status: AC
  Filled 2018-04-05: qty 2

## 2018-04-05 MED ORDER — MIDAZOLAM HCL 5 MG/5ML IJ SOLN
INTRAMUSCULAR | Status: DC | PRN
  Administered 2018-04-05: 1 mg via INTRAVENOUS

## 2018-04-05 MED ORDER — FENTANYL CITRATE (PF) 100 MCG/2ML IJ SOLN
INTRAMUSCULAR | Status: DC | PRN
  Administered 2018-04-05: 25 ug via INTRAVENOUS

## 2018-04-05 MED ORDER — LIDOCAINE HCL (PF) 2 % IJ SOLN
INTRAMUSCULAR | Status: AC
  Filled 2018-04-05: qty 10

## 2018-04-05 MED ORDER — PROPOFOL 500 MG/50ML IV EMUL
INTRAVENOUS | Status: DC | PRN
  Administered 2018-04-05: 50 ug/kg/min via INTRAVENOUS

## 2018-04-05 MED ORDER — PIPERACILLIN-TAZOBACTAM 3.375 G IVPB
3.3750 g | Freq: Once | INTRAVENOUS | Status: AC
  Administered 2018-04-05: 3.375 g via INTRAVENOUS

## 2018-04-05 MED ORDER — SODIUM CHLORIDE 0.9 % IV SOLN
INTRAVENOUS | Status: DC
  Administered 2018-04-05: 1000 mL via INTRAVENOUS

## 2018-04-05 MED ORDER — SODIUM CHLORIDE 0.9 % IV SOLN: INTRAVENOUS | Status: DC

## 2018-04-05 NOTE — Op Note (Signed)
[facility] Gastroenterology Patient Name: Kevin Marks Procedure Date: 04/05/2018 1:21 PM MRN: [id number] Account #: [id number] Date of Birth: [birth year] Admit Type: Outpatient Age: 77 Room: [facility] ENDO ROOM 1 Gender: Male Note Status: Finalized Procedure:            Colonoscopy Indications:          High risk colon cancer surveillance: Personal history                        of colonic polyps Providers:            Manya Silvas, MD Referring MD:         Youlanda Roys. Lovie Macadamia, MD (Referring MD) Medicines:            Propofol per Anesthesia Complications:        No immediate complications. Procedure:            Pre-Anesthesia Assessment:                       - After reviewing the risks and benefits, the patient                        was deemed in satisfactory condition to undergo the                        procedure.                       After obtaining informed consent, the colonoscope was                        passed under direct vision. Throughout the procedure,                        the patient's blood pressure, pulse, and oxygen                        saturations were monitored continuously. The                        Colonoscope was introduced through the anus and                        advanced to the the cecum, identified by appendiceal                        orifice and ileocecal valve. The colonoscopy was                        performed without difficulty. The patient tolerated the                        procedure well. The quality of the bowel preparation                        was good. Findings:      Three sessile polyps were found in the splenic flexure and hepatic       flexure. The polyps were diminutive in size. These polyps were removed       with a jumbo cold forceps. Resection and retrieval were complete.  Multiple small and large-mouthed diverticula were found in the sigmoid       colon, descending colon and transverse colon.  Internal hemorrhoids were found during endoscopy. The hemorrhoids were       small and Grade I (internal hemorrhoids that do not prolapse).      The exam was otherwise without abnormality. Impression:           - Three diminutive polyps at the splenic flexure and at                        the hepatic flexure, removed with a jumbo cold forceps.                        Resected and retrieved.                       - Diverticulosis in the sigmoid colon, in the                        descending colon and in the transverse colon.                       - Internal hemorrhoids.                       - The examination was otherwise normal. Recommendation:       - Await pathology results. Manya Silvas, MD 04/05/2018 1:48:50 PM This report has been signed electronically. Number of Addenda: 0 Note Initiated On: 04/05/2018 1:21 PM Scope Withdrawal Time: 0 hours 8 minutes 23 seconds  Total Procedure Duration: 0 hours 14 minutes 17 seconds       [facility]

## 2018-04-05 NOTE — H&P (Signed)
Primary Care Physician:  Juluis Pitch, MD Primary Gastroenterologist:  Dr. Vira Agar  Pre-Procedure History & Physical: HPI:  Kevin Marks is a 77 y.o. male is here for an colonoscopy.  This is for [facility] colon polyps.   Past Medical History:  Diagnosis Date  . Allergic genetic state   . Arthritis   . BPH (benign prostatic hyperplasia)   . Colon polyp   . Dyspepsia   . ED (erectile dysfunction)   . GERD (gastroesophageal reflux disease)   . History of colon polyps   . Hypercholesterolemia   . Hypertension   . MI, old     Past Surgical History:  Procedure Laterality Date  . COLONOSCOPY  2013   Dr Vira Agar  . JOINT REPLACEMENT Left 2018  . PROSTATE SURGERY  2009    Prior to Admission medications   Medication Sig Start Date End Date Taking? Authorizing Provider  aspirin EC 81 MG tablet Take by mouth.   Yes [provider]  atorvastatin (LIPITOR) 40 MG tablet TAKE 1 TABLET BY MOUTH EVERY DAY 05/23/14  Yes [provider]  cyclobenzaprine (FLEXERIL) 5 MG tablet Take 5 mg by mouth 3 (three) times daily as needed for muscle spasms.   Yes [provider]  EQL NATURAL ZINC 50 MG TABS Take by mouth.   Yes [provider]  hydrALAZINE (APRESOLINE) 50 MG tablet Take 50 mg by mouth 3 (three) times daily.   Yes [provider]  hydrochlorothiazide (HYDRODIURIL) 12.5 MG tablet  04/23/16  Yes [provider]  losartan (COZAAR) 100 MG tablet  04/22/16  Yes [provider]  metoprolol (LOPRESSOR) 50 MG tablet TAKE 1 TABLET BY MOUTH TWICE DAILY 12/26/14  Yes [provider]  Omega-3 Fatty Acids (FISH OIL) 1000 MG CPDR Take by mouth daily.   Yes [provider]  omeprazole (PRILOSEC) 20 MG capsule Take 20 mg by mouth daily.  02/11/16  Yes [provider]  potassium chloride (K-DUR,KLOR-CON) 10 MEQ tablet Take 10 mEq by mouth 2 (two) times daily.   Yes [provider]  SILDENAFIL CITRATE PO Take  by mouth every 30 (thirty) days.    [provider]    Allergies as of 02/08/2018  . (No Known Allergies)    Family History  Problem Relation Age of Onset  . Alzheimer's disease Mother   . Cancer Father     Social History   Socioeconomic History  . Marital status: Married    Spouse name: Not on file  . Number of children: Not on file  . Years of education: Not on file  . Highest education level: Not on file  Occupational History  . Not on file  Social Needs  . Financial resource strain: Not on file  . Food insecurity:    Worry: Not on file    Inability: Not on file  . Transportation needs:    Medical: Not on file    Non-medical: Not on file  Tobacco Use  . Smoking status: Former Smoker    Packs/day: 1.00    Years: 40.00    Pack years: 40.00  . Smokeless tobacco: Never Used  Substance and Sexual Activity  . Alcohol use: No  . Drug use: No  . Sexual activity: Not on file  Lifestyle  . Physical activity:    Days per week: Not on file    Minutes per session: Not on file  . Stress: Not on file  Relationships  . Social connections:  Talks on phone: Not on file    Gets together: Not on file    Attends religious service: Not on file    Active member of club or organization: Not on file    Attends meetings of clubs or organizations: Not on file    Relationship status: Not on file  . Intimate partner violence:    Fear of current or ex partner: Not on file    Emotionally abused: Not on file    Physically abused: Not on file    Forced sexual activity: Not on file  Other Topics Concern  . Not on file  Social History Narrative  . Not on file    Review of Systems: See HPI, otherwise negative ROS  Physical Exam: BP 118/82   Pulse 85   Temp (!) 96.4 F (35.8 C) (Tympanic)   Resp 18   Ht 5' 10.5" (1.791 m)   Wt 82.6 kg   SpO2 100%   BMI 25.75 kg/m  General:   Alert,  pleasant and cooperative in NAD Head:  Normocephalic and atraumatic. Neck:   Supple; no masses or thyromegaly. Lungs:  Clear throughout to auscultation.    Heart:  Regular rate and rhythm. Abdomen:  Soft, nontender and nondistended. Normal bowel sounds, without guarding, and without rebound.   Neurologic:  Alert and  oriented x4;  grossly normal neurologically.  Impression/Plan: Kevin Marks is here for an colonoscopy to be performed for [facility] colon polyps.  Risks, benefits, limitations, and alternatives regarding  colonoscopy have been reviewed with the patient.  Questions have been answered.  All parties agreeable.   Gaylyn Cheers, MD  04/05/2018, 1:17 PM

## 2018-04-05 NOTE — Anesthesia Post-op Follow-up Note (Signed)
Anesthesia QCDR form completed.        

## 2018-04-05 NOTE — Anesthesia Preprocedure Evaluation (Addendum)
Anesthesia Evaluation  Patient identified by MRN, date of birth, ID band Patient awake    Reviewed: Allergy & Precautions, H&P , NPO status , Patient's Chart, lab work & pertinent test results  Airway Mallampati: III       Dental  (+) Missing   Pulmonary former smoker,           Cardiovascular hypertension, + Past MI (believes it was 30 years ago) and +CHF  (-) Cardiac Stents and (-) CABG  Rhythm:regular  Echo 2016: MODERATE LV SYSTOLIC DYSFUNCTION (EF 27%) WITH MILD LVH MODERATE VALVULAR REGURGITATION NO VALVULAR STENOSIS MILD PHTN Mod MR, mild TR and PR   Neuro/Psych negative neurological ROS  negative psych ROS   GI/Hepatic Neg liver ROS, GERD  ,  Endo/Other  negative endocrine ROS  Renal/GU negative Renal ROS  negative genitourinary   Musculoskeletal  (+) Arthritis ,   Abdominal   Peds  Hematology negative hematology ROS (+)   Anesthesia Other Findings Past Medical History: No date: Allergic genetic state No date: Arthritis No date: BPH (benign prostatic hyperplasia) No date: Colon polyp No date: Dyspepsia No date: ED (erectile dysfunction) No date: GERD (gastroesophageal reflux disease) No date: History of colon polyps No date: Hypercholesterolemia No date: Hypertension No date: MI, old  Past Surgical History: 2013: COLONOSCOPY     Comment:  Dr Vira Agar 2018: JOINT REPLACEMENT; Left 2009: PROSTATE SURGERY     Reproductive/Obstetrics negative OB ROS                           Anesthesia Physical Anesthesia Plan  ASA: III  Anesthesia Plan: General   Post-op Pain Management:    Induction:   PONV Risk Score and Plan: Propofol infusion and TIVA  Airway Management Planned:   Additional Equipment:   Intra-op Plan:   Post-operative Plan:   Informed Consent: I have reviewed the patients History and Physical, chart, labs and discussed the procedure including the  risks, benefits and alternatives for the proposed anesthesia with the patient or authorized representative who has indicated his/her understanding and acceptance.   Dental Advisory Given  Plan Discussed with: Anesthesiologist, CRNA and Surgeon  Anesthesia Plan Comments:         Anesthesia Quick Evaluation

## 2018-04-05 NOTE — Transfer of Care (Signed)
Immediate Anesthesia Transfer of Care Note  Patient: Kevin Marks  Procedure(s) Performed: COLONOSCOPY WITH PROPOFOL (N/A )  Patient Location: PACU  Anesthesia Type:General  Level of Consciousness: sedated  Airway & Oxygen Therapy: Patient Spontanous Breathing and Patient connected to nasal cannula oxygen  Post-op Assessment: Report given to RN and Post -op Vital signs reviewed and stable  Post vital signs: Reviewed and stable  Last Vitals:  Vitals Value Taken Time  BP    Temp    Pulse 94 04/05/2018  1:51 PM  Resp 20 04/05/2018  1:51 PM  SpO2 98 % 04/05/2018  1:51 PM  Vitals shown include unvalidated device data.  Last Pain:  Vitals:   04/05/18 1222  TempSrc: Tympanic         Complications: No apparent anesthesia complications

## 2018-04-06 NOTE — Anesthesia Postprocedure Evaluation (Signed)
Anesthesia Post Note  Patient: Kevin Marks  Procedure(s) Performed: COLONOSCOPY WITH PROPOFOL (N/A )  Patient location during evaluation: PACU Anesthesia Type: General Level of consciousness: awake and alert Pain management: pain level controlled Vital Signs Assessment: post-procedure vital signs reviewed and stable Respiratory status: spontaneous breathing, nonlabored ventilation and respiratory function stable Cardiovascular status: blood pressure returned to baseline and stable Postop Assessment: no apparent nausea or vomiting Anesthetic complications: no     Last Vitals:  Vitals:   04/05/18 1358 04/05/18 1408  BP: 106/78 (!) 128/91  Pulse: 79 86  Resp: 19 16  Temp:    SpO2: 94% 97%    Last Pain:  Vitals:   04/06/18 0808  TempSrc:   PainSc: 0-No pain                 Durenda Hurt

## 2018-04-07 LAB — SURGICAL PATHOLOGY

## 2018-05-25 ENCOUNTER — Encounter: Payer: Self-pay | Admitting: General Surgery

## 2018-05-25 ENCOUNTER — Other Ambulatory Visit: Payer: Self-pay

## 2018-05-25 ENCOUNTER — Ambulatory Visit: Payer: Medicare HMO | Admitting: General Surgery

## 2018-05-25 VITALS — BP 134/82 | HR 84 | Temp 97.5°F | Resp 20 | Ht 70.5 in | Wt 189.8 lb

## 2018-05-25 DIAGNOSIS — K625 Hemorrhage of anus and rectum: Secondary | ICD-10-CM | POA: Diagnosis not present

## 2018-05-25 DIAGNOSIS — K429 Umbilical hernia without obstruction or gangrene: Secondary | ICD-10-CM

## 2018-05-25 NOTE — Progress Notes (Addendum)
Patient ID: Kevin Marks, male   DOB: Jul 06, 1940, 77 y.o.   MRN: [id number]  Chief Complaint  Patient presents with  . Follow-up    office visit-Umbilical hernia,hemorrhoids-PCP referral    HPI Kevin Marks is a 77 y.o. male here today for Umbilical hernia and hemorrhoids patient was referred by Dr. Vira Agar he states he was born with the umbilical hernia has some soreness associated with it, noticed the hemorrhoids 8 years ago has no pain but some bleeding. Patient is here today with his wife and daughter.    The patient reports he rarely has discomfort from the hernia, usually with direct pressure to the area or if he is been doing a lot of strenuous activity.  His wife seem to think it bothers him more than he is willing to admit. HPI  Past Medical History:  Diagnosis Date  . A-fib ([place])   . Allergic genetic state   . Arthritis   . BPH (benign prostatic hyperplasia)   . Colon polyp   . Dyspepsia   . ED (erectile dysfunction)   . GERD (gastroesophageal reflux disease)   . History of colon polyps   . Hypercholesterolemia   . Hypertension   . MI, old     Past Surgical History:  Procedure Laterality Date  . COLONOSCOPY  2013   Dr Vira Agar  . COLONOSCOPY WITH PROPOFOL N/A 04/05/2018   Procedure: COLONOSCOPY WITH PROPOFOL;  Surgeon: Manya Silvas, MD;  Location: [facility] ENDOSCOPY;  Service: Endoscopy;  Laterality: N/A;  . JOINT REPLACEMENT Left 2018  . left knee replacement    . PROSTATE SURGERY  2009    Family History  Problem Relation Age of Onset  . Alzheimer's disease Mother   . Cancer Father     Social History Social History   Tobacco Use  . Smoking status: Former Smoker    Packs/day: 1.00    Years: 40.00    Pack years: 40.00  . Smokeless tobacco: Never Used  Substance Use Topics  . Alcohol use: No  . Drug use: No    No Known Allergies  Current Outpatient Medications  Medication Sig Dispense Refill  . aspirin EC 81 MG tablet Take by mouth.    Marland Kitchen  atorvastatin (LIPITOR) 40 MG tablet TAKE 1 TABLET BY MOUTH EVERY DAY    . cyclobenzaprine (FLEXERIL) 5 MG tablet Take 5 mg by mouth 3 (three) times daily as needed for muscle spasms.    Marland Kitchen ELIQUIS 5 MG TABS tablet Take 5 mg by mouth 2 (two) times daily.  11  . EQL NATURAL ZINC 50 MG TABS Take by mouth.    . hydrALAZINE (APRESOLINE) 50 MG tablet Take 50 mg by mouth 3 (three) times daily.    . hydrochlorothiazide (HYDRODIURIL) 12.5 MG tablet     . losartan (COZAAR) 100 MG tablet     . metoprolol (LOPRESSOR) 50 MG tablet TAKE 1 TABLET BY MOUTH TWICE DAILY    . Omega-3 Fatty Acids (FISH OIL) 1000 MG CPDR Take by mouth daily.    Marland Kitchen omeprazole (PRILOSEC) 20 MG capsule Take 20 mg by mouth daily.     . potassium chloride (K-DUR,KLOR-CON) 10 MEQ tablet Take 10 mEq by mouth 2 (two) times daily.    . psyllium (METAMUCIL) 58.6 % packet Take 1 packet by mouth daily.     No current facility-administered medications for this visit.     Review of Systems Review of Systems  Constitutional: Negative.   Respiratory: Negative.  Cardiovascular: Negative.     Blood pressure 134/82, pulse 84, temperature (!) 97.5 F (36.4 C), temperature source Temporal, resp. rate 20, height 5' 10.5" (1.791 m), weight 189 lb 12.8 oz (86.1 kg), SpO2 97 %.  Physical Exam Physical Exam  Constitutional: He is oriented to person, place, and time. He appears well-developed and well-nourished.  Eyes: Conjunctivae are normal. No scleral icterus.  Cardiovascular: Normal rate and normal heart sounds. A regularly irregular rhythm present.  Pulmonary/Chest: Effort normal and breath sounds normal.  Abdominal:    Genitourinary: Rectum normal. Prostate is enlarged (left lobe enlarged and firm.).  Lymphadenopathy:    He has no cervical adenopathy.  Neurological: He is alert and oriented to person, place, and time.  Skin: Skin is warm and dry.    Data Reviewed April 05, 2018 colonoscopy completed by Gaylyn Cheers, MD was  reviewed.  Grade 1 internal hemorrhoids were identified during the exam as well as 3 small polyps. A. COLON POLYP, HEPATIC FLEXURE; COLD BIOPSY:  - TUBULAR ADENOMA.  - NEGATIVE FOR HIGH-GRADE DYSPLASIA OR MALIGNANCY.   B. COLON POLYP, HEPATIC FLEXURE; COLD BIOPSY:  - TUBULAR ADENOMA.  - NEGATIVE FOR HIGH-GRADE DYSPLASIA OR MALIGNANCY.   C. COLON POLYP, SPLENIC FLEXURE; COLD BIOPSY:  - TUBULAR ADENOMA.  - NEGATIVE FOR HIGH-GRADE DYSPLASIA OR MALIGNANCY.   Anoscopy was completed.  There is a mildly prominent internal hemorrhoid cluster on the posterior wall, left.  No bleeding.  Too small to band.  Otherwise the anorectal area is unremarkable.  No stool for Hemoccult.  Assessment    Umbilical hernia, 0.5 cm change over the last 2 years, occasionally symptomatic.  Rectal bleeding with defecation, worse with hard stools.  Minimal internal hemorrhoidal disease, otherwise unremarkable colonoscopy.  PSA April 30, 2016: 1.05.     Plan The patient reports he is making use of MiraLAX intermittently and does obtain a softer, easier to pass stool.  He has been encouraged to continue this routine.  No significant bleeding source identified to warrant surgical intervention.  Significant asymmetry in the prostate with the left lobe quite harder than the right and prominent.  No distinct nodule.  The patient has seen Mare Ferrari, MD in the past.  The patient is going to take responsibility for scheduling an appointment with Dr. Rogers Blocker for reassessment.  The umbilical hernia has changed very slightly over the last 2 years.  The patient needs to decide if it is troublesome enough to warrant elective repair.  Risks associated with the unlikely event of incarceration were reviewed.  He will notify the office of how he would like to proceed in this regard. HPI, Physical Exam, Assessment and Plan have been scribed under the direction and in the presence of Hervey Ard, Md.  Eudelia Bunch R.  Bobette Mo, CMA  I have completed the exam and reviewed the above documentation for accuracy and completeness.  I agree with the above.  Haematologist has been used and any errors in dictation or transcription are unintentional.  Hervey Ard, M.D., F.A.C.S.  Forest Gleason Nazair Fortenberry 05/26/2018, 10:12 AM

## 2018-05-26 DIAGNOSIS — K649 Unspecified hemorrhoids: Secondary | ICD-10-CM | POA: Insufficient documentation

## 2018-11-11 ENCOUNTER — Other Ambulatory Visit (HOSPITAL_COMMUNITY): Payer: Self-pay | Admitting: Urology

## 2018-11-11 ENCOUNTER — Other Ambulatory Visit: Payer: Self-pay | Admitting: Urology

## 2018-11-11 DIAGNOSIS — R319 Hematuria, unspecified: Secondary | ICD-10-CM

## 2018-11-16 ENCOUNTER — Ambulatory Visit: Admission: RE | Admit: 2018-11-16 | Payer: Medicare HMO | Source: Ambulatory Visit

## 2018-11-18 ENCOUNTER — Other Ambulatory Visit: Payer: Self-pay

## 2018-11-18 ENCOUNTER — Ambulatory Visit
Admission: RE | Admit: 2018-11-18 | Discharge: 2018-11-18 | Disposition: A | Discharge status: Home / Self Care | Payer: Medicare HMO | Source: Ambulatory Visit | Attending: Urology | Admitting: Urology

## 2018-11-18 DIAGNOSIS — R319 Hematuria, unspecified: Secondary | ICD-10-CM | POA: Insufficient documentation

## 2018-11-18 MED ORDER — IOHEXOL 300 MG/ML  SOLN
150.0000 mL | Freq: Once | INTRAMUSCULAR | Status: AC | PRN
  Administered 2018-11-18: 09:00:00 125 mL via INTRAVENOUS

## 2018-12-08 NOTE — H&P (Signed)
NAME: Kevin Marks, SPECHT MEDICAL RECORD IO:[id number] ACCOUNT [id number] DATE OF BIRTH:07-Jun-1941 FACILITY: [facility] LOCATION:  PHYSICIAN:MICHAEL R. WOLFF, MD  HISTORY AND PHYSICAL  DATE OF ADMISSION:  12/21/2018  CHIEF COMPLAINT:  Bloody urine and difficulty voiding.  HISTORY OF PRESENT ILLNESS:  The patient is a 78 year old Caucasian male who presented to the office with gross hematuria and significant lower urinary tract symptoms.  Evaluation in the office included a CT scan on 05/21 which revealed small bilateral  stones, but no renal lesions.  CT scan also confirmed evidence of a large prostate.  Cystoscopy on 05/28 indicated nodular regrowth of the prostate gland in the midportion of the prostate causing visual obstruction.  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  He comes in now for transurethral resection of bladder tumor and photovaporization of prostate with GreenLight laser as well as instillation of mitomycin C  postop.  ALLERGIES:  No drug allergies.  CURRENT MEDICATIONS:  Included cyclobenzaprine, potassium chloride, amlodipine, atorvastatin, losartan, metoprolol, hydralazine, hydrochlorothiazide and Eliquis.  PAST SURGICAL HISTORY: 1.  Left knee replacement in 2018. 2.  Photovaporization of prostate with GreenLight laser 2013.  PAST AND CURRENT MEDICAL CONDITIONS: 1.  Hypertension. 2.  Hyperlipidemia. 3.  Chronic low back pain. 4.  GERD.  REVIEW OF SYSTEMS:  The patient had decreased auditory and visual acuity.  He has shortness of breath.  He has chronic constipation.  He has chronic joint discomfort.  He reports bruising easily.  He denies chest pain, diabetes or stroke.  PHYSICAL EXAMINATION: VITAL SIGNS:  Height 5 feet 7 inches, weight 185. GENERAL:  A well-nourished, white male in no acute distress. HEENT:  Sclerae were clear.  Pupils were equally round, reactive to light  and accommodation.  Extraocular movements were intact. NECK:  Supple, no palpable cervical adenopathy. LYMPHATIC:  No palpable neck or inguinal adenopathy. PULMONARY:  Lungs clear to auscultation. CARDIOVASCULAR:  Regular rhythm and rate without audible murmurs. ABDOMEN:  Soft, nontender abdomen. GENITOURINARY:  Circumcised.  Testes smooth, nontender, 18 cc in size each. RECTAL:  Greater than 40 g, slightly nodular prostate. NEUROMUSCULAR:  Alert and oriented x3.  Most recent PSA 1.4 ng/mL on 05/13.  IMPRESSION: 1.  Gross hematuria. 2.  Bladder tumor. 3.  Benign prostatic hypertrophy with bladder outlet obstruction.  PLAN: 1.  Transurethral resection of bladder tumor with instillation of mitomycin C. 2.  Photovaporization of prostate with GreenLight laser.  LN/NUANCE  D:12/08/2018 T:12/08/2018 JOB:[id number]

## 2018-12-17 ENCOUNTER — Other Ambulatory Visit
Admission: RE | Admit: 2018-12-17 | Discharge: 2018-12-17 | Disposition: A | Discharge status: Home / Self Care | Payer: Medicare HMO | Source: Ambulatory Visit | Attending: Urology | Admitting: Urology

## 2018-12-17 ENCOUNTER — Other Ambulatory Visit: Payer: Self-pay

## 2018-12-17 DIAGNOSIS — Z1159 Encounter for screening for other viral diseases: Secondary | ICD-10-CM | POA: Insufficient documentation

## 2018-12-17 NOTE — Patient Instructions (Addendum)
  Your procedure is scheduled on: Tuesday December 21, 2018 Report to Same Day Surgery 2nd floor [facility] Entrance-take elevator on left to 2nd floor.  Check in with surgery information desk.) To find out your arrival time, call [phone] 1:00-3:00 PM on Monday December 20, 2018  Remember: Instructions that are not followed completely may result in serious medical risk, up to and including death, or upon the discretion of your surgeon and anesthesiologist your surgery may need to be rescheduled.    __x__ 1. Do not eat food (including mints, candies, chewing gum) after midnight the night before your procedure. You may drink clear liquids up to 2 hours before you are scheduled to arrive at the hospital for your procedure.  Do not drink anything within 2 hours of your scheduled arrival to the hospital.  Approved clear liquids:  --Water or Apple juice without pulp  --Clear carbohydrate beverage such as Gatorade or Powerade  --Black Coffee or Clear Tea (No milk, no creamers, do not add anything to the coffee or tea)    __x__ 2. No Alcohol for 24 hours before or after surgery.   __x__ 3. No Smoking or e-cigarettes for 24 hours before surgery.  Do not use any chewable tobacco products for at least 6 hours before surgery.   __x__ 4. Notify your doctor if there is any change in your medical condition (cold, fever, infections).   __x__ 5. On the morning of surgery brush your teeth with toothpaste and water.  You may rinse your mouth with mouthwash if you wish.  Do not swallow any toothpaste or mouthwash.  Please read over the following fact sheets that you were given:   [facility] Preparing for Surgery and/or MRSA Information    __x__ Use antibacterial soap such as Dial to shower/bathe on the day of surgery.   Do not wear jewelry on the day of surgery.  Do not wear lotions, powders, deodorant, or perfumes.   Do not shave below the face/neck 48 hours prior to surgery.   Do not  bring valuables to the hospital.    [facility] is not responsible for any belongings or valuables.              For patients admitted to the hospital, discharge time is determined by your treatment team.  For patients discharged on the day of surgery, you will NOT be permitted to drive yourself home.  You must have a responsible adult with you for 24 hours after surgery.  __x__ Take these medicines on the morning of surgery with a SMALL SIP OF WATER:  1. Metoprolol (Lopressor)  2. Amlodipine (Norvasc)  3. Atorvastatin (Lipitor)  4. Omeprazole (Prilosec)    Skip your Entresto only on the morning of surgery.  __x__ Follow recommendations from Cardiologist, Pulmonologist or PCP regarding stopping Aspirin, Coumadin, Plavix, Eliquis, Effient, Pradaxa, and Pletal.  You have already stopped your Eliquis and Aspirin as of December 16, 2018  __x__ TODAY: Do not take any Anti-inflammatories such as Advil, Ibuprofen, Motrin, Aleve, Naproxen, Naprosyn, BC/Goodies powders or aspirin products. You may take Tylenol if needed.   __x__ TODAY: Do not take any over the counter supplements until after surgery. You have already stopped your Zinc and Fish Oil as of December 16, 2018.

## 2018-12-18 LAB — NOVEL CORONAVIRUS, NAA (HOSP ORDER, SEND-OUT TO REF LAB; TAT 18-24 HRS): SARS-CoV-2, NAA: NOT DETECTED

## 2018-12-20 ENCOUNTER — Encounter: Payer: Self-pay | Admitting: Anesthesiology

## 2018-12-21 ENCOUNTER — Ambulatory Visit: Payer: Medicare HMO | Admitting: Anesthesiology

## 2018-12-21 ENCOUNTER — Other Ambulatory Visit: Payer: Self-pay

## 2018-12-21 ENCOUNTER — Encounter
Admission: RE | Disposition: A | Discharge status: Home / Self Care | Payer: Self-pay | Source: Home / Self Care | Attending: Urology

## 2018-12-21 ENCOUNTER — Ambulatory Visit
Admission: RE | Admit: 2018-12-21 | Discharge: 2018-12-21 | Disposition: A | Discharge status: Home / Self Care | Payer: Medicare HMO | Attending: Urology | Admitting: Urology

## 2018-12-21 ENCOUNTER — Encounter: Payer: Self-pay | Admitting: *Deleted

## 2018-12-21 DIAGNOSIS — I10 Essential (primary) hypertension: Secondary | ICD-10-CM | POA: Diagnosis not present

## 2018-12-21 DIAGNOSIS — N138 Other obstructive and reflux uropathy: Secondary | ICD-10-CM | POA: Diagnosis not present

## 2018-12-21 DIAGNOSIS — Z79899 Other long term (current) drug therapy: Secondary | ICD-10-CM | POA: Insufficient documentation

## 2018-12-21 DIAGNOSIS — Z7901 Long term (current) use of anticoagulants: Secondary | ICD-10-CM | POA: Insufficient documentation

## 2018-12-21 DIAGNOSIS — E785 Hyperlipidemia, unspecified: Secondary | ICD-10-CM | POA: Diagnosis not present

## 2018-12-21 DIAGNOSIS — Z96652 Presence of left artificial knee joint: Secondary | ICD-10-CM | POA: Insufficient documentation

## 2018-12-21 DIAGNOSIS — N401 Enlarged prostate with lower urinary tract symptoms: Secondary | ICD-10-CM | POA: Insufficient documentation

## 2018-12-21 DIAGNOSIS — C679 Malignant neoplasm of bladder, unspecified: Secondary | ICD-10-CM | POA: Insufficient documentation

## 2018-12-21 HISTORY — PX: GREEN LIGHT LASER TURP (TRANSURETHRAL RESECTION OF PROSTATE: SHX6260

## 2018-12-21 HISTORY — PX: MITOMYCIN C APPLICATION: SHX6375

## 2018-12-21 HISTORY — PX: TRANSURETHRAL RESECTION OF BLADDER TUMOR: SHX2575

## 2018-12-21 SURGERY — TURBT (TRANSURETHRAL RESECTION OF BLADDER TUMOR)
Anesthesia: General | Site: Prostate

## 2018-12-21 MED ORDER — FENTANYL CITRATE (PF) 100 MCG/2ML IJ SOLN
INTRAMUSCULAR | Status: AC
  Filled 2018-12-21: qty 2

## 2018-12-21 MED ORDER — DEXAMETHASONE SODIUM PHOSPHATE 10 MG/ML IJ SOLN
INTRAMUSCULAR | Status: DC | PRN
  Administered 2018-12-21: 10 mg via INTRAVENOUS

## 2018-12-21 MED ORDER — ONDANSETRON HCL 4 MG/2ML IJ SOLN
INTRAMUSCULAR | Status: DC | PRN
  Administered 2018-12-21: 4 mg via INTRAVENOUS

## 2018-12-21 MED ORDER — DEXAMETHASONE SODIUM PHOSPHATE 10 MG/ML IJ SOLN
INTRAMUSCULAR | Status: AC
  Filled 2018-12-21: qty 1

## 2018-12-21 MED ORDER — CEFAZOLIN SODIUM-DEXTROSE 1-4 GM/50ML-% IV SOLN
INTRAVENOUS | Status: AC
  Filled 2018-12-21: qty 50

## 2018-12-21 MED ORDER — LACTATED RINGERS IV SOLN
INTRAVENOUS | Status: DC
  Administered 2018-12-21: 13:00:00 via INTRAVENOUS

## 2018-12-21 MED ORDER — LIDOCAINE HCL (CARDIAC) PF 100 MG/5ML IV SOSY
PREFILLED_SYRINGE | INTRAVENOUS | Status: DC | PRN
  Administered 2018-12-21: 100 mg via INTRAVENOUS

## 2018-12-21 MED ORDER — ROCURONIUM BROMIDE 50 MG/5ML IV SOLN
INTRAVENOUS | Status: AC
  Filled 2018-12-21: qty 1

## 2018-12-21 MED ORDER — BELLADONNA ALKALOIDS-OPIUM 16.2-60 MG RE SUPP
RECTAL | Status: AC
  Filled 2018-12-21: qty 1

## 2018-12-21 MED ORDER — MIDAZOLAM HCL 2 MG/2ML IJ SOLN
INTRAMUSCULAR | Status: DC | PRN
  Administered 2018-12-21: 2 mg via INTRAVENOUS

## 2018-12-21 MED ORDER — LIDOCAINE HCL (PF) 2 % IJ SOLN
INTRAMUSCULAR | Status: AC
  Filled 2018-12-21: qty 10

## 2018-12-21 MED ORDER — UROGESIC-BLUE 81.6 MG PO TABS: 1.0000 | ORAL_TABLET | Freq: Four times a day (QID) | ORAL | 3 refills | Status: DC | PRN

## 2018-12-21 MED ORDER — ONDANSETRON HCL 4 MG/2ML IJ SOLN
INTRAMUSCULAR | Status: AC
  Filled 2018-12-21: qty 2

## 2018-12-21 MED ORDER — PHENYLEPHRINE HCL (PRESSORS) 10 MG/ML IV SOLN
INTRAVENOUS | Status: DC | PRN
  Administered 2018-12-21 (×2): 200 ug via INTRAVENOUS
  Administered 2018-12-21: 150 ug via INTRAVENOUS
  Administered 2018-12-21: 200 ug via INTRAVENOUS

## 2018-12-21 MED ORDER — GLYCOPYRROLATE 0.2 MG/ML IJ SOLN
INTRAMUSCULAR | Status: DC | PRN
  Administered 2018-12-21: 0.2 mg via INTRAVENOUS

## 2018-12-21 MED ORDER — ONDANSETRON HCL 4 MG/2ML IJ SOLN: 4.0000 mg | Freq: Once | INTRAMUSCULAR | Status: DC | PRN

## 2018-12-21 MED ORDER — PROPOFOL 10 MG/ML IV BOLUS
INTRAVENOUS | Status: AC
  Filled 2018-12-21: qty 20

## 2018-12-21 MED ORDER — FENTANYL CITRATE (PF) 100 MCG/2ML IJ SOLN
INTRAMUSCULAR | Status: DC | PRN
  Administered 2018-12-21 (×4): 25 ug via INTRAVENOUS

## 2018-12-21 MED ORDER — CEFAZOLIN SODIUM-DEXTROSE 1-4 GM/50ML-% IV SOLN
1.0000 g | Freq: Once | INTRAVENOUS | Status: AC
  Administered 2018-12-21: 1 g via INTRAVENOUS

## 2018-12-21 MED ORDER — LIDOCAINE HCL URETHRAL/MUCOSAL 2 % EX GEL
CUTANEOUS | Status: AC
  Filled 2018-12-21: qty 10

## 2018-12-21 MED ORDER — MITOMYCIN CHEMO FOR BLADDER INSTILLATION 40 MG
INTRAVENOUS | Status: DC | PRN
  Administered 2018-12-21: 40 mg via INTRAVESICAL

## 2018-12-21 MED ORDER — SODIUM CHLORIDE 0.9 % IV SOLN
INTRAVENOUS | Status: DC | PRN
  Administered 2018-12-21: 50 ug/min via INTRAVENOUS

## 2018-12-21 MED ORDER — LIDOCAINE HCL URETHRAL/MUCOSAL 2 % EX GEL
CUTANEOUS | Status: DC | PRN
  Administered 2018-12-21: 1 via URETHRAL

## 2018-12-21 MED ORDER — FENTANYL CITRATE (PF) 100 MCG/2ML IJ SOLN: 25.0000 ug | INTRAMUSCULAR | Status: DC | PRN

## 2018-12-21 MED ORDER — BELLADONNA ALKALOIDS-OPIUM 16.2-60 MG RE SUPP
RECTAL | Status: DC | PRN
  Administered 2018-12-21: 1 via RECTAL

## 2018-12-21 MED ORDER — MIDAZOLAM HCL 2 MG/2ML IJ SOLN
INTRAMUSCULAR | Status: AC
  Filled 2018-12-21: qty 2

## 2018-12-21 MED ORDER — DOCUSATE SODIUM 100 MG PO CAPS: 200.0000 mg | ORAL_CAPSULE | Freq: Two times a day (BID) | ORAL | 3 refills | Status: DC

## 2018-12-21 MED ORDER — PROPOFOL 10 MG/ML IV BOLUS
INTRAVENOUS | Status: DC | PRN
  Administered 2018-12-21: 100 mg via INTRAVENOUS

## 2018-12-21 MED ORDER — ACETAMINOPHEN-CODEINE #3 300-30 MG PO TABS: 1.0000 | ORAL_TABLET | ORAL | 1 refills | Status: DC | PRN

## 2018-12-21 SURGICAL SUPPLY — 34 items
ADAPTER IRRIG TUBE 2 SPIKE SOL (ADAPTER) ×8 IMPLANT
BAG DRAIN CYSTO-URO LG1000N (MISCELLANEOUS) ×4 IMPLANT
BAG URINE DRAINAGE (UROLOGICAL SUPPLIES) ×4 IMPLANT
CATH FOLEY 2WAY  5CC 20FR SIL (CATHETERS) ×2
CATH FOLEY 2WAY 5CC 20FR SIL (CATHETERS) ×2 IMPLANT
COVER WAND RF STERILE (DRAPES) ×2 IMPLANT
CYSTOSCOPE CON FLOW (MISCELLANEOUS) ×2 IMPLANT
DRSG TELFA 4X3 1S NADH ST (GAUZE/BANDAGES/DRESSINGS) ×4 IMPLANT
ELECT LOOP 22F BIPOLAR SML (ELECTROSURGICAL) ×4
ELECTRODE LOOP 22F BIPOLAR SML (ELECTROSURGICAL) IMPLANT
FEE DELIVERY LASER CO2 FORTEC (MISCELLANEOUS) IMPLANT
GLOVE BIO SURGEON STRL SZ7.5 (GLOVE) ×4 IMPLANT
GOWN STRL REUS W/ TWL LRG LVL3 (GOWN DISPOSABLE) ×2 IMPLANT
GOWN STRL REUS W/ TWL LRG LVL4 (GOWN DISPOSABLE) ×2 IMPLANT
GOWN STRL REUS W/ TWL XL LVL3 (GOWN DISPOSABLE) ×2 IMPLANT
GOWN STRL REUS W/TWL LRG LVL3 (GOWN DISPOSABLE) ×2
GOWN STRL REUS W/TWL LRG LVL4 (GOWN DISPOSABLE) ×2
GOWN STRL REUS W/TWL XL LVL3 (GOWN DISPOSABLE) ×2
IV NS 1000ML (IV SOLUTION) ×2
IV NS 1000ML BAXH (IV SOLUTION) ×2 IMPLANT
IV SET PRIMARY 15D 139IN B9900 (IV SETS) ×4 IMPLANT
KIT TURNOVER CYSTO (KITS) ×4 IMPLANT
LASER CO2 FORTEC DELIVERY FEE (MISCELLANEOUS) ×4 IMPLANT
LASER GREENLIGHT XPS PROCEDURE (MISCELLANEOUS) ×2 IMPLANT
LASER GRNLGT MOXY FIBER 750UM (MISCELLANEOUS) ×2 IMPLANT
LASER XPS ACCESS DROP OFF FEE (MISCELLANEOUS) ×2 IMPLANT
LOOP CUT BIPOLAR 24F LRG (ELECTROSURGICAL) ×2 IMPLANT
PACK CYSTO AR (MISCELLANEOUS) ×4 IMPLANT
SET IRRIG Y TYPE TUR BLADDER L (SET/KITS/TRAYS/PACK) ×8 IMPLANT
SOL .9 NS 3000ML IRR  AL (IV SOLUTION) ×8
SOL .9 NS 3000ML IRR UROMATIC (IV SOLUTION) ×8 IMPLANT
SURGILUBE 2OZ TUBE FLIPTOP (MISCELLANEOUS) ×4 IMPLANT
SYRINGE IRR TOOMEY STRL 70CC (SYRINGE) ×4 IMPLANT
WATER STERILE IRR 1000ML POUR (IV SOLUTION) ×4 IMPLANT

## 2018-12-21 NOTE — Anesthesia Procedure Notes (Signed)
Procedure Name: LMA Insertion Date/Time: 12/21/2018 1:17 PM Performed by: Lavone Orn, CRNA Pre-anesthesia Checklist: Patient identified, Emergency Drugs available, Suction available, Patient being monitored and Timeout performed Patient Re-evaluated:Patient Re-evaluated prior to induction Oxygen Delivery Method: Circle system utilized Preoxygenation: Pre-oxygenation with 100% oxygen Induction Type: IV induction Ventilation: Mask ventilation without difficulty LMA: LMA inserted LMA Size: 5.0 Number of attempts: 2 Placement Confirmation: positive ETCO2 and breath sounds checked- equal and bilateral Tube secured with: Tape Dental Injury: Teeth and Oropharynx as per pre-operative assessment

## 2018-12-21 NOTE — Anesthesia Preprocedure Evaluation (Signed)
Anesthesia Evaluation  Patient identified by MRN, date of birth, ID band Patient awake    Reviewed: Allergy & Precautions, NPO status , Patient's Chart, lab work & pertinent test results, reviewed documented beta blocker date and time   Airway Mallampati: II  TM Distance: >3 FB     Dental  (+) Chipped   Pulmonary former smoker,           Cardiovascular hypertension, Pt. on medications and Pt. on home beta blockers + Past MI  + dysrhythmias Atrial Fibrillation      Neuro/Psych    GI/Hepatic GERD  Controlled,  Endo/Other    Renal/GU      Musculoskeletal  (+) Arthritis ,   Abdominal   Peds  Hematology   Anesthesia Other Findings EKG from 4 yrs ok, except for Poor R waves.  Reproductive/Obstetrics                             Anesthesia Physical Anesthesia Plan  ASA: III  Anesthesia Plan: General   Post-op Pain Management:    Induction: Intravenous  PONV Risk Score and Plan:   Airway Management Planned: LMA  Additional Equipment:   Intra-op Plan:   Post-operative Plan:   Informed Consent: I have reviewed the patients History and Physical, chart, labs and discussed the procedure including the risks, benefits and alternatives for the proposed anesthesia with the patient or authorized representative who has indicated his/her understanding and acceptance.       Plan Discussed with: CRNA  Anesthesia Plan Comments:         Anesthesia Quick Evaluation

## 2018-12-21 NOTE — Op Note (Signed)
Preoperative diagnosis: 1.  Bladder cancer                                            2.  BPH with bladder outlet obstruction  Postoperative diagnosis: Same  Procedure: 1.  Transurethral resection of bladder tumors (10x10 mm and 5X8 mm)                      2.  Photo vaporization of the prostate with greenlight laser                      3.  Instillation of mitomycin-C into the bladder  Surgeon: Otelia Limes. Yves Dill MD  Anesthesia: General  Indications:See the history and physical. After informed consent the above procedure(s) were requested     Technique and findings: After adequate general anesthesia had been obtained the patient was placed into dorsal lithotomy position and the perineum was prepped and draped in the usual fashion.  The resectoscope sheath was placed into the bladder with the obturator in place.  The resectoscope was then placed into the sheath and the bladder was thoroughly inspected.  Patient had 2 papillary bladder tumors in the right side of the bladder.  Proximal tumor measured 10 x 10 mm and was localized along the right lateral wall.  The most distal tumor measured 5 x 8 mm and was located lateral to the right ureteral orifice.  Both tumors were resected with the loop and specimen submitted to pathology.  Muscularis fragments were obtained.  The base of the tumor resection sites were cauterized.  This point the resectoscope was removed and the laser scope visually advanced into the bladder.  Patient had a large median lobe obstructing the prostatic fossa.  Greenlight laser was then advanced through the scope and power was set at 79 W.  Bladder neck tissue and median lobe were then vaporized.  There was then increased to 120 W and remaining obstructive tissue vaporized.  Bleeders were controlled with the coagulation setting.  The laser scope was then removed and 10 cc of viscous Xylocaine instilled within the urethra.  46 French Foley catheter was placed and irrigated until  clear.  40 milligrams of mitomycin-C was then instilled within the bladder and the catheter was clamped.  A B&O suppository was placed.  Procedure was then terminated and patient transferred to the recovery room in stable condition.  Blood loss was less than 20 cc.

## 2018-12-21 NOTE — H&P (Signed)
Date of Initial H&P: 12/08/18  History reviewed, patient examined, no change in status, stable for surgery.

## 2018-12-21 NOTE — Transfer of Care (Signed)
Immediate Anesthesia Transfer of Care Note  Patient: Kevin Marks  Procedure(s) Performed: TRANSURETHRAL RESECTION OF BLADDER TUMOR (TURBT) (N/A Bladder) GREEN LIGHT LASER TURP (TRANSURETHRAL RESECTION OF PROSTATE (N/A Prostate) MITOMYCIN C APPLICATION (N/A Bladder)  Patient Location: PACU  Anesthesia Type:General  Level of Consciousness: drowsy  Airway & Oxygen Therapy: Patient Spontanous Breathing and Patient connected to face mask oxygen  Post-op Assessment: Report given to RN and Post -op Vital signs reviewed and stable  Post vital signs: stable  Last Vitals:  Vitals Value Taken Time  BP 128/88 12/21/18 1430  Temp 36.6 C 12/21/18 1430  Pulse 95 12/21/18 1430  Resp 19 12/21/18 1434  SpO2 100 % 12/21/18 1430  Vitals shown include unvalidated device data.  Last Pain:  Vitals:   12/21/18 1430  TempSrc:   PainSc: Asleep      Patients Stated Pain Goal: 0 (48/62/82 [id number])  Complications: No apparent anesthesia complications

## 2018-12-21 NOTE — Anesthesia Post-op Follow-up Note (Signed)
Anesthesia QCDR form completed.        

## 2018-12-21 NOTE — Discharge Instructions (Signed)
Benign Prostatic Hyperplasia  Benign prostatic hyperplasia (BPH) is an enlarged prostate gland that is caused by the normal aging process and not by cancer. The prostate is a walnut-sized gland that is involved in the production of semen. It is located in front of the rectum and below the bladder. The bladder stores urine and the urethra is the tube that carries the urine out of the body. The prostate may get bigger as a man gets older. An enlarged prostate can press on the urethra. This can make it harder to pass urine. The build-up of urine in the bladder can cause infection. Back pressure and infection may progress to bladder damage and kidney (renal) failure. What are the causes? This condition is part of a normal aging process. However, not all men develop problems from this condition. If the prostate enlarges away from the urethra, urine flow will not be blocked. If it enlarges toward the urethra and compresses it, there will be problems passing urine. What increases the risk? This condition is more likely to develop in men over the age of 57 years. What are the signs or symptoms? Symptoms of this condition include:  Getting up often during the night to urinate.  Needing to urinate frequently during the day.  Difficulty starting urine flow.  Decrease in size and strength of your urine stream.  Leaking (dribbling) after urinating.  Inability to pass urine. This needs immediate treatment.  Inability to completely empty your bladder.  Pain when you pass urine. This is more common if there is also an infection.  Urinary tract infection (UTI). How is this diagnosed? This condition is diagnosed based on your medical history, a physical exam, and your symptoms. Tests will also be done, such as:  A post-void bladder scan. This measures any amount of urine that may remain in your bladder after you finish urinating.  A digital rectal exam. In a rectal exam, your health care provider  checks your prostate by putting a lubricated, gloved finger into your rectum to feel the back of your prostate gland. This exam detects the size of your gland and any abnormal lumps or growths.  An exam of your urine (urinalysis).  A prostate specific antigen (PSA) screening. This is a blood test used to screen for prostate cancer.  An ultrasound. This test uses sound waves to electronically produce a picture of your prostate gland. Your health care provider may refer you to a specialist in kidney and prostate diseases (urologist). How is this treated? Once symptoms begin, your health care provider will monitor your condition (active surveillance or watchful waiting). Treatment for this condition will depend on the severity of your condition. Treatment may include:  Observation and yearly exams. This may be the only treatment needed if your condition and symptoms are mild.  Medicines to relieve your symptoms, including: ? Medicines to shrink the prostate. ? Medicines to relax the muscle of the prostate.  Surgery in severe cases. Surgery may include: ? Prostatectomy. In this procedure, the prostate tissue is removed completely through an open incision or with a laparascope or robotics. ? Transurethral resection of the prostate (TURP). In this procedure, a tool is inserted through the opening at the tip of the penis (urethra). It is used to cut away tissue of the inner core of the prostate. The pieces are removed through the same opening of the penis. This removes the blockage. ? Transurethral incision (TUIP). In this procedure, small cuts are made in the prostate. This lessens  the prostate's pressure on the urethra. ? Transurethral microwave thermotherapy (TUMT). This procedure uses microwaves to create heat. The heat destroys and removes a small amount of prostate tissue. ? Transurethral needle ablation (TUNA). This procedure uses radio frequencies to destroy and remove a small amount of  prostate tissue. ? Interstitial laser coagulation ([place]). This procedure uses a laser to destroy and remove a small amount of prostate tissue. ? Transurethral electrovaporization (TUVP). This procedure uses electrodes to destroy and remove a small amount of prostate tissue. ? Prostatic urethral lift. This procedure inserts an implant to push the lobes of the prostate away from the urethra. Follow these instructions at home:  Take over-the-counter and prescription medicines only as told by your health care provider.  Monitor your symptoms for any changes. Contact your health care provider with any changes.  Avoid drinking large amounts of liquid before going to bed or out in public.  Avoid or reduce how much caffeine or alcohol you drink.  Give yourself time when you urinate.  Keep all follow-up visits as told by your health care provider. This is important. Contact a health care provider if:  You have unexplained back pain.  Your symptoms do not get better with treatment.  You develop side effects from the medicine you are taking.  Your urine becomes very dark or has a bad smell.  Your lower abdomen becomes distended and you have trouble passing your urine. Get help right away if:  You have a fever or chills.  You suddenly cannot urinate.  You feel lightheaded, or very dizzy, or you faint.  There are large amounts of blood or clots in the urine.  Your urinary problems become hard to manage.  You develop moderate to severe low back or flank pain. The flank is the side of your body between the ribs and the hip. These symptoms may represent a serious problem that is an emergency. Do not wait to see if the symptoms will go away. Get medical help right away. Call your local emergency services (911 in the [place] Do not drive yourself to the hospital. Summary  Benign prostatic hyperplasia (BPH) is an enlarged prostate that is caused by the normal aging process and not by  cancer.  An enlarged prostate can press on the urethra. This can make it hard to pass urine.  This condition is part of a normal aging process and is more likely to develop in men over the age of 31 years.  Get help right away if you suddenly cannot urinate. This information is not intended to replace advice given to you by your health care provider. Make sure you discuss any questions you have with your health care provider. Document Released: 06/16/2005 Document Revised: 07/21/2016 Document Reviewed: 07/21/2016 Elsevier Interactive Patient Education  2019 [facility] Prostate laser surgery is a procedure to eliminate prostate tissue. The surgery is performed with an instrument that uses a beam of light (laser). The procedure may be done when a man experiences complications from an enlarged prostate gland, such as:  Inability to urinate.  Recurring urinary tract infections.  Urinary bladder stones. There are two types of prostate laser surgeries:  Ablation. In this type a laser is used to vaporize prostate tissue.  Enucleation. In this type a laser is used to cut out prostate tissue. Tell a health care provider about:  Any allergies you have.  All medicines you are taking, including vitamins, herbs, eye drops, creams, and over-the-counter  medicines.  Any problems you or family members have had with anesthetic medicines.  Any blood disorders you have.  Any surgeries you have had.  Any medical conditions you have. What are the risks? Generally, this is a safe procedure. However, problems may occur, including:  Bleeding and the need for a blood transfusion.  Urinary tract infection.  Erectile dysfunction.  Narrowing of the urethra, which blocks the flow of urine.  Dry ejaculation. What happens before the procedure? Staying hydrated Follow instructions from your health care provider about hydration, which may include:  Up to 2 hours  before the procedure - you may continue to drink clear liquids, such as water, clear fruit juice, black coffee, and plain tea. Eating and drinking restrictions Follow instructions from your health care provider about eating and drinking, which may include:  8 hours before the procedure - stop eating heavy meals or foods such as meat, fried foods, or fatty foods.  6 hours before the procedure - stop eating light meals or foods, such as toast or cereal.  6 hours before the procedure - stop drinking milk or drinks that contain milk.  2 hours before the procedure - stop drinking clear liquids. Medicines Ask your health care provider about:  Changing or stopping your regular medicines. This is especially important if you are taking diabetes medicines or blood thinners.  Taking medicines such as aspirin and ibuprofen. These medicines can thin your blood. Do not take these medicines before your procedure if your health care provider instructs you not to. General instructions  Plan to have someone take you home from the hospital or clinic.  If you will be going home right after the procedure, plan to have someone with you for 24 hours. What happens during the procedure?  To reduce your risk of infection: ? Your health care team will wash or sanitize their hands. ? Your skin will be washed with soap. ? Hair may be removed from the surgical area. ? You may be given an antibiotic medicine.  An IV tube will be inserted into one of your veins.  You will be given one or more of the following: ? A medicine to help you relax (sedative). ? A medicine to numb the area (local anesthetic). ? A medicine that is injected into your spine to numb the area below and slightly above the injection site (spinal anesthetic). ? A medicine to make you fall asleep (general anesthetic).  An instrument (laser) will be inserted through the penis into the urethra, and a viewing scope and other instruments will be  placed inside of the urethra.  Prostate tissue will either be vaporized or cut away and removed.  The laser beam will be used to stop any bleeding.  A thin, flexible tube (urinary catheter) will be inserted into your bladder to drain urine to the outside of your body. The procedure may vary among health care providers and hospitals. What happens after the procedure?  Your blood pressure, heart rate, breathing rate, and blood oxygen level will be monitored until the medicines you were given have worn off.  You will be given fluids through the IV tube. The IV tube will be removed when you are eating and drinking appropriately.  The urinary catheter will be removed as soon as possible. It may need to be in place for up to 24 hours.  Depending on your specific needs, you may be admitted to the hospital or you will be sent home after the procedure. If  you are sent home: ? Do not drive for 24 hours if you were given a sedative. ? You may be given elastic support stockings to help prevent blood clots in your legs. ? You may be given a stool softener. Summary  Prostate laser surgery is a procedure that is used to treat an enlarged prostate gland.  The procedure involves inserting a scope and laser into the urethra to reach the prostate gland.  You may need to have a urinary catheter in place immediately after the surgery for up to 24 hours.  If you will be going home right after the procedure, plan to have someone with you for 24 hours. This information is not intended to replace advice given to you by your health care provider. Make sure you discuss any questions you have with your health care provider. Document Released: 06/16/2005 Document Revised: 02/01/2016 Document Reviewed: 02/01/2016 Elsevier Interactive Patient Education  2019 [place] Light Laser Prostate Treatment  Green light laser therapy is a procedure that uses a high-energy laser to get rid of extra prostate  tissue by turning the tissue into a vapor. It is less invasive than traditional methods of prostate surgery, which involve cutting out the prostate tissue. Because the tissue is turned into a vapor (vaporized) rather than cut out, there is generally less blood loss. This surgery is used to treat an enlarged prostate gland (benign prostatic hyperplasia). Tell a health care provider about:  Any allergies you have.  All medicines you are taking, including vitamins, herbs, eye drops, creams, and over-the-counter medicines.  Any problems you or family members have had with anesthetic medicines.  Any blood disorders you have.  Any surgeries you have had.  Any medical conditions you have. What are the risks? Generally, this is a safe procedure. However, problems may occur, including:  Infection.  Bleeding.  Allergic reaction to medicines.  Damage to other structures or organs.  Blood in the urine (hematuria).  Painful urination.  Urinary tract infection.  Erectile dysfunction (rare).  Dry ejaculation.  Scar tissue in the urinary passage. What happens before the procedure? Staying hydrated Follow instructions from your health care provider about hydration, which may include:  Up to 2 hours before the procedure - you may continue to drink clear liquids, such as water, clear fruit juice, black coffee, and plain tea. Eating and drinking restrictions Follow instructions from your health care provider about eating and drinking, which may include:  8 hours before the procedure - stop eating heavy meals or foods such as meat, fried foods, or fatty foods.  6 hours before the procedure - stop eating light meals or foods, such as toast or cereal.  6 hours before the procedure - stop drinking milk or drinks that contain milk.  2 hours before the procedure - stop drinking clear liquids. Medicines  Ask your health care provider about: ? Changing or stopping your regular medicines.  This is especially important if you are taking diabetes medicines or blood thinners. ? Taking medicines such as aspirin and ibuprofen. These medicines can thin your blood. Do not take these medicines before your procedure if your health care provider instructs you not to.  You may be prescribed antibiotic medicine. If so, take your antibiotic as told by your health care provider. Do not stop taking the antibiotic even if you start to feel better. General instructions  Plan to have someone take you home from the hospital or clinic.  If you will be  going home right after the procedure, plan to have someone with you for 24 hours. What happens during the procedure?  To reduce your risk of infection: ? Your health care team will wash or sanitize their hands. ? Your skin will be washed with soap.  An IV will be inserted into one of your veins.  You will be given one or more of the following: ? A medicine to help you relax (sedative). ? A medicine to make you fall asleep (general anesthetic). ? A medicine that is injected into your spine to numb the area below and slightly above the injection site (spinal anesthetic).  A tube containing viewing scopes and instruments (fiber-optic scope) will be passed through the urethra and into the prostate. The opening of the urethra is at the end of the penis.  A thin fiber will be put through the tube and positioned next to the extra prostate tissue.  Pulses of laser light will come from the end of the fiber and be projected onto the extra tissue. Your blood will absorb the light, become hot, and vaporize the extra prostate tissue.  The heat from the laser beam will seal off the blood vessels, which will lessen bleeding.  The fiber-optic scope will be removed.  A thin tube will be inserted into the urethra to drain your urine (urinary catheter). The procedure may vary among health care providers and hospitals. What happens after the procedure?  Your  blood pressure, heart rate, breathing rate, and blood oxygen level will be monitored until the medicines you were given have worn off.  Depending on factors such as the amount of prostate tissue that was vaporized, the strength of your bladder, and the amount of bleeding expected, your catheter may be removed before you go home.  You may be given elastic support stockings to wear to help prevent blood clots in your legs.  Do not drive for 24 hours if you were given a sedative, or for as long as directed by your health care provider. Summary  Green light laser therapy is a procedure that uses a high-energy laser that turns extra prostate tissue into a vapor.  This procedure is less invasive than traditional methods of prostate surgery.  Follow instructions from your health care provider about eating and drinking before the procedure.  Pulses of laser light will come from the end of a thin fiber and be aimed at the extra prostate tissue. Your blood will absorb the light, become hot, and vaporize the extra tissue. This information is not intended to replace advice given to you by your health care provider. Make sure you discuss any questions you have with your health care provider. Document Released: 09/23/2007 Document Revised: 03/25/2017 Document Reviewed: 07/05/2016 Elsevier Interactive Patient Education  2019 [employer]   Transurethral Vaporization of Prostate, Care After This sheet gives you information about how to care for yourself after your procedure. Your health care provider may also give you more specific instructions. If you have problems or questions, contact your health care provider. What can I expect after the procedure? After the procedure, it is common to have:  Pain or discomfort around your penis.  Blood in your urine.  Burning during urination.  Sudden urges to urinate.  Little or no semen produced during ejaculation (retrograde ejaculation). Follow these  instructions at home: Medicines   Take over-the-counter and prescription medicines only as told by your health care provider.  If you were prescribed an antibiotic medicine, take it  as told by your health care provider. Do not stop taking the antibiotic even if you start to feel better. Activity  Do not drive for 24 hours if you were given a medicine to help you relax (sedative) during your procedure.  Do not drive or use heavy machinery while taking prescription pain medicine.  Do not resume sexual activity until your health care provider approves. Ask your health care provider: ? What activities are safe for you. ? When you may return to your normal activities.  Do not lift anything that is heavier than 10 lb (4.5 kg), or the limit that you are told, until your health care provider says that it is safe. General instructions   Drink enough fluid to keep your urine clear or pale yellow.  To prevent or treat constipation while you are taking prescription pain medicine, your health care provider may recommend that you: ? Take over-the-counter or prescription medicines. ? Eat foods that are high in fiber, such as fresh fruits and vegetables, whole grains, and beans. ? Limit foods that are high in fat and processed sugars, such as fried and sweet foods.  If you go home with a tube draining your urine (urinary catheter), care for your catheter as told by your health care provider. This may include: ? Washing your hands before and after you touch the catheter. ? Keeping the area around the catheter clean and dry. ? Emptying the catheter bag when it is full and monitoring the amount and color of your urine. ? Avoiding bending or breaking the catheter. ? Keeping air out of the catheter. ? Not putting the catheter underwater. ? Visiting your health care provider to have the catheter removed.  Keep all follow-up visits as told by your health care provider. This is important. Contact a  health care provider if:  You have: ? A fever or chills. ? Trouble urinating or controlling when you urinate. ? More blood in your urine instead of less. ? Problems getting an erection.  You start to pass blood clots in your urine.  You have nausea or you vomit.  There is more redness, swelling, or pain in your penis. Get help right away if:  You have bright red urine.  You cannot urinate.  You have severe pain that does not get better with medicine.  You have shortness of breath. Summary  After this procedure, it is common to have some blood in your urine. If you see bright red blood in your urine, however, you should get medical help right away.  This procedure may cause you to produce little or no semen when ejaculating (retrograde ejaculation).  Follow restrictions about lifting and sexual activity as told by your health care provider. Ask what activities are safe for you.  Drink enough fluid to keep your urine clear or pale yellow. This information is not intended to replace advice given to you by your health care provider. Make sure you discuss any questions you have with your health care provider. Document Released: 08/27/2016 Document Revised: 08/27/2016 Document Reviewed: 08/27/2016 Elsevier Interactive Patient Education  2019 [employer] Indwelling Urinary Catheter Care, Adult An indwelling urinary catheter is a thin tube that is put into your bladder. The tube helps to drain pee (urine) out of your body. The tube goes in through your urethra. Your urethra is where pee comes out of your body. Your pee will come out through the catheter, then it will go into a bag (drainage bag). Take good care  of your catheter so it will work well. How to wear your catheter and bag Supplies needed  Sticky tape (adhesive tape) or a leg strap.  Alcohol wipe or soap and water (if you use tape).  A clean towel (if you use tape).  Large overnight bag.  Smaller bag (leg  bag). Wearing your catheter Attach your catheter to your leg with tape or a leg strap.  Make sure the catheter is not pulled tight.  If a leg strap gets wet, take it off and put on a dry strap.  If you use tape to hold the bag on your leg: 1. Use an alcohol wipe or soap and water to wash your skin where the tape made it sticky before. 2. Use a clean towel to pat-dry that skin. 3. Use new tape to make the bag stay on your leg. Wearing your bags You should have been given a large overnight bag.  You may wear the overnight bag in the day or night.  Always have the overnight bag lower than your bladder.  Do not let the bag touch the floor.  Before you go to sleep, put a clean plastic bag in a wastebasket. Then hang the overnight bag inside the wastebasket. You should also have a smaller leg bag that fits under your clothes.  Always wear the leg bag below your knee.  Do not wear your leg bag at night. How to care for your skin and catheter Supplies needed  A clean washcloth.  Water and mild soap.  A clean towel. Caring for your skin and catheter      Clean the skin around your catheter every day: ? Wash your hands with soap and water. ? Wet a clean washcloth in warm water and mild soap. ? Clean the skin around your urethra. ? If you are male: ? Gently spread the folds of skin around your vagina (labia). ? With the washcloth in your other hand, wipe the inner side of your labia on each side. Wipe from front to back. ? If you are male: ? Pull back any skin that covers the end of your penis (foreskin). ? With the washcloth in your other hand, wipe your penis in small circles. Start wiping at the tip of your penis, then move away from the catheter. ? With your free hand, hold the catheter close to where it goes into your body. ? Keep holding the catheter during cleaning so it does not get pulled out. ? With the washcloth in your other hand, clean the catheter. ? Only wipe  downward on the catheter. ? Do not wipe upward toward your body. Doing this may push germs into your urethra and cause infection. ? Use a clean towel to pat-dry the catheter and the skin around it. Make sure to wipe off all soap. ? Wash your hands with soap and water.  Shower every day. Do not take baths.  Do not use cream, ointment, or lotion on the area where the catheter goes into your body, unless your doctor tells you to.  Do not use powders, sprays, or lotions on your genital area.  Check your skin around the catheter every day for signs of infection. Check for: ? Redness, swelling, or pain. ? Fluid or blood. ? Warmth. ? Pus or a bad smell. How to empty the bag Supplies needed  Rubbing alcohol.  Gauze pad or cotton ball.  Tape or a leg strap. Emptying the bag Pour the pee out of  your bag when it is ?- full, or at least 2-3 times a day. Do this for your overnight bag and your leg bag. 1. Wash your hands with soap and water. 2. Separate (detach) the bag from your leg. 3. Hold the bag over the toilet or a clean pail. Keep the bag lower than your hips and bladder. This is so the pee (urine) does not go back into the tube. 4. Open the pour spout. It is at the bottom of the bag. 5. Empty the pee into the toilet or pail. Do not let the pour spout touch any surface. 6. Put rubbing alcohol on a gauze pad or cotton ball. 7. Use the gauze pad or cotton ball to clean the pour spout. 8. Close the pour spout. 9. Attach the bag to your leg with tape or a leg strap. 10. Wash your hands with soap and water. Follow instructions for cleaning the drainage bag:  From the product maker.  As told by your doctor. How to change the bag Supplies needed  Alcohol wipes.  A clean bag.  Tape or a leg strap. Changing the bag Replace your bag with a clean bag once a month. If it starts to leak, smell bad, or look dirty, change it sooner. 1. Wash your hands with soap and water. 2. Separate  the dirty bag from your leg. 3. Pinch the catheter with your fingers so that pee does not spill out. 4. Separate the catheter tube from the bag tube where these tubes connect (at the connection valve). Do not let the tubes touch any surface. 5. Clean the end of the catheter tube with an alcohol wipe. Use a different alcohol wipe to clean the end of the bag tube. 6. Connect the catheter tube to the tube of the clean bag. 7. Attach the clean bag to your leg with tape or a leg strap. Do not make the bag tight on your leg. 8. Wash your hands with soap and water. General rules   Never pull on your catheter. Never try to take it out. Doing that can hurt you.  Always wash your hands before and after you touch your catheter or bag. Use a mild, fragrance-free soap. If you do not have soap and water, use hand sanitizer.  Always make sure there are no twists or bends (kinks) in the catheter tube.  Always make sure there are no leaks in the catheter or bag.  Drink enough fluid to keep your pee pale yellow.  Do not take baths, swim, or use a hot tub.  If you are male, wipe from front to back after you poop (have a bowel movement). Contact a doctor if:  Your pee is cloudy.  Your pee smells worse than usual.  Your catheter gets clogged.  Your catheter leaks.  Your bladder feels full. Get help right away if:  You have redness, swelling, or pain where the catheter goes into your body.  You have fluid, blood, pus, or a bad smell coming from the area where the catheter goes into your body.  Your skin feels warm where the catheter goes into your body.  You have a fever.  You have pain in your: ? Belly (abdomen). ? Legs. ? Lower back. ? Bladder.  You see blood in the catheter.  Your pee is pink or red.  You feel sick to your stomach (nauseous).  You throw up (vomit).  You have chills.  Your pee is not draining into the  bag.  Your catheter gets pulled out. Summary  An  indwelling urinary catheter is a thin tube that is placed into the bladder to help drain pee (urine) out of the body.  The catheter is placed into the part of the body that drains pee from the bladder (urethra).  Taking good care of your catheter will keep it working properly and help prevent problems.  Always wash your hands before and after touching your catheter or bag.  Never pull on your catheter or try to take it out. This information is not intended to replace advice given to you by your health care provider. Make sure you discuss any questions you have with your health care provider. Document Released: 10/11/2012 Document Revised: 12/07/2017 Document Reviewed: 01/30/2017 Elsevier Interactive Patient Education  2019 Elsevier Inc.  Transurethral Resection of Bladder Tumor  Transurethral resection of a bladder tumor is the removal (resection) of a cancerous growth (tumor) on the inside wall of the bladder. The bladder is the organ that holds urine. The tumor is removed through the tube that carries urine out of the body (urethra). In a transurethral resection, a thin telescope with a light, a tiny camera, and an electric cutting edge (resectoscope) is passed through the urethra. In men, the opening of the urethra is at the end of the penis. In women, it is just above the opening of the vagina. Tell a health care provider about:  Any allergies you have.  All medicines you are taking, including vitamins, herbs, eye drops, creams, and over-the-counter medicines.  Any problems you or family members have had with anesthetic medicines.  Any blood disorders you have.  Any surgeries you have had.  Any medical conditions you have.  Any recent urinary tract infections you have had.  Whether you are pregnant or may be pregnant. What are the risks? Generally, this is a safe procedure. However, problems may occur, including:  Infection.  Bleeding.  Allergic reactions to  medicines.  Damage to nearby structures or organs, such as: ? The urethra. ? The tubes that drain urine from the kidneys into the bladder (ureters).  Pain and burning during urination.  Difficulty urinating due to partial blockage of the urethra.  Inability to urinate (urinary retention). What happens before the procedure? Staying hydrated Follow instructions from your health care provider about hydration, which may include:  Up to 2 hours before the procedure - you may continue to drink clear liquids, such as water, clear fruit juice, black coffee, and plain tea.  Eating and drinking restrictions Follow instructions from your health care provider about eating and drinking, which may include:  8 hours before the procedure - stop eating heavy meals or foods, such as meat, fried foods, or fatty foods.  6 hours before the procedure - stop eating light meals or foods, such as toast or cereal.  6 hours before the procedure - stop drinking milk or drinks that contain milk.  2 hours before the procedure - stop drinking clear liquids. Medicines Ask your health care provider about:  Changing or stopping your regular medicines. This is especially important if you are taking diabetes medicines or blood thinners.  Taking medicines such as aspirin and ibuprofen. These medicines can thin your blood. Do not take these medicines unless your health care provider tells you to take them.  Taking over-the-counter medicines, vitamins, herbs, and supplements. Tests You may have exams or tests, including:  Physical exam.  Blood tests.  Urine tests.  Electrocardiogram (ECG). This  test measures the electrical activity of the heart. General instructions  Plan to have someone take you home from the hospital or clinic.  Ask your health care provider how your surgical site will be marked or identified.  Ask your health care provider what steps will be taken to help prevent infection. These may  include: ? Washing skin with a germ-killing soap. ? Taking antibiotic medicine. What happens during the procedure?  An IV will be inserted into one of your veins.  You will be given one or more of the following: ? A medicine to help you relax (sedative). ? A medicine to make you fall asleep (general anesthetic). ? A medicine that is injected into your spine to numb the area below and slightly above the injection site (spinal anesthetic).  Your legs will be placed in foot rests (stirrups) so that your legs are apart and your knees are bent.  The resectoscope will be passed through your urethra and into your bladder.  The part of your bladder that is affected by the tumor will be resected using the cutting edge of the resectoscope.  The resectoscope will be removed.  A thin, flexible tube (catheter) will be passed through your urethra and into your bladder. The catheter will drain urine into a bag outside of your body. ? Fluid may be passed through the catheter to keep the catheter open. The procedure may vary among health care providers and hospitals. What happens after the procedure?  Your blood pressure, heart rate, breathing rate, and blood oxygen level will be monitored until you leave the hospital or clinic.  You may continue to receive fluids and medicines through an IV.  You will have some pain. You will be given pain medicine to relieve pain.  You will have a catheter to drain your urine. ? You will have blood in your urine. Your catheter may be kept in until your urine is clear. ? The amount of urine will be monitored. If necessary, your bladder may be rinsed out (irrigated) by passing fluid through your catheter.  You will be encouraged to walk around as soon as possible.  You may have to wear compression stockings. These stockings help to prevent blood clots and reduce swelling in your legs.  Do not drive for 24 hours if you were given a sedative during your  procedure. Summary  Transurethral resection of a bladder tumor is the removal (resection) of a cancerous growth (tumor) on the inside wall of the bladder.  To do this procedure, your health care provider uses a thin telescope with a light, a tiny camera, and an electric cutting edge (resectoscope).  Follow your health care provider's instructions. You may need to stop or change certain medicines, and you may be told to stop eating and drinking several hours before the procedure.  Your blood pressure, heart rate, breathing rate, and blood oxygen level will be monitored until you leave the hospital or clinic.  You may have to wear compression stockings. These stockings help to prevent blood clots and reduce swelling in your legs. This information is not intended to replace advice given to you by your health care provider. Make sure you discuss any questions you have with your health care provider. Document Released: 04/12/2009 Document Revised: 01/15/2018 Document Reviewed: 01/15/2018 Elsevier Interactive Patient Education  2019 Elsevier Inc.  Transurethral Resection of Bladder Tumor  Transurethral resection of a bladder tumor is the removal (resection) of a cancerous growth (tumor) on the inside wall of  the bladder. The bladder is the organ that holds urine. The tumor is removed through the tube that carries urine out of the body (urethra). In a transurethral resection, a thin telescope with a light, a tiny camera, and an electric cutting edge (resectoscope) is passed through the urethra. In men, the opening of the urethra is at the end of the penis. In women, it is just above the opening of the vagina. Tell a health care provider about:  Any allergies you have.  All medicines you are taking, including vitamins, herbs, eye drops, creams, and over-the-counter medicines.  Any problems you or family members have had with anesthetic medicines.  Any blood disorders you have.  Any surgeries  you have had.  Any medical conditions you have.  Any recent urinary tract infections you have had.  Whether you are pregnant or may be pregnant. What are the risks? Generally, this is a safe procedure. However, problems may occur, including:  Infection.  Bleeding.  Allergic reactions to medicines.  Damage to nearby structures or organs, such as: ? The urethra. ? The tubes that drain urine from the kidneys into the bladder (ureters).  Pain and burning during urination.  Difficulty urinating due to partial blockage of the urethra.  Inability to urinate (urinary retention). What happens before the procedure? Staying hydrated Follow instructions from your health care provider about hydration, which may include:  Up to 2 hours before the procedure - you may continue to drink clear liquids, such as water, clear fruit juice, black coffee, and plain tea.  Eating and drinking restrictions Follow instructions from your health care provider about eating and drinking, which may include:  8 hours before the procedure - stop eating heavy meals or foods, such as meat, fried foods, or fatty foods.  6 hours before the procedure - stop eating light meals or foods, such as toast or cereal.  6 hours before the procedure - stop drinking milk or drinks that contain milk.  2 hours before the procedure - stop drinking clear liquids. Medicines Ask your health care provider about:  Changing or stopping your regular medicines. This is especially important if you are taking diabetes medicines or blood thinners.  Taking medicines such as aspirin and ibuprofen. These medicines can thin your blood. Do not take these medicines unless your health care provider tells you to take them.  Taking over-the-counter medicines, vitamins, herbs, and supplements. Tests You may have exams or tests, including:  Physical exam.  Blood tests.  Urine tests.  Electrocardiogram (ECG). This test measures the  electrical activity of the heart. General instructions  Plan to have someone take you home from the hospital or clinic.  Ask your health care provider how your surgical site will be marked or identified.  Ask your health care provider what steps will be taken to help prevent infection. These may include: ? Washing skin with a germ-killing soap. ? Taking antibiotic medicine. What happens during the procedure?  An IV will be inserted into one of your veins.  You will be given one or more of the following: ? A medicine to help you relax (sedative). ? A medicine to make you fall asleep (general anesthetic). ? A medicine that is injected into your spine to numb the area below and slightly above the injection site (spinal anesthetic).  Your legs will be placed in foot rests (stirrups) so that your legs are apart and your knees are bent.  The resectoscope will be passed through your urethra  and into your bladder.  The part of your bladder that is affected by the tumor will be resected using the cutting edge of the resectoscope.  The resectoscope will be removed.  A thin, flexible tube (catheter) will be passed through your urethra and into your bladder. The catheter will drain urine into a bag outside of your body. ? Fluid may be passed through the catheter to keep the catheter open. The procedure may vary among health care providers and hospitals. What happens after the procedure?  Your blood pressure, heart rate, breathing rate, and blood oxygen level will be monitored until you leave the hospital or clinic.  You may continue to receive fluids and medicines through an IV.  You will have some pain. You will be given pain medicine to relieve pain.  You will have a catheter to drain your urine. ? You will have blood in your urine. Your catheter may be kept in until your urine is clear. ? The amount of urine will be monitored. If necessary, your bladder may be rinsed out (irrigated) by  passing fluid through your catheter.  You will be encouraged to walk around as soon as possible.  You may have to wear compression stockings. These stockings help to prevent blood clots and reduce swelling in your legs.  Do not drive for 24 hours if you were given a sedative during your procedure. Summary  Transurethral resection of a bladder tumor is the removal (resection) of a cancerous growth (tumor) on the inside wall of the bladder.  To do this procedure, your health care provider uses a thin telescope with a light, a tiny camera, and an electric cutting edge (resectoscope).  Follow your health care provider's instructions. You may need to stop or change certain medicines, and you may be told to stop eating and drinking several hours before the procedure.  Your blood pressure, heart rate, breathing rate, and blood oxygen level will be monitored until you leave the hospital or clinic.  You may have to wear compression stockings. These stockings help to prevent blood clots and reduce swelling in your legs. This information is not intended to replace advice given to you by your health care provider. Make sure you discuss any questions you have with your health care provider. Document Released: 04/12/2009 Document Revised: 01/15/2018 Document Reviewed: 01/15/2018 Elsevier Interactive Patient Education  2019 [employer] transuretral de tumor de vejiga, cuidados posteriores Transurethral Resection of Bladder Tumor, Care After Esta hoja le brinda informacin sobre cmo cuidarse despus del procedimiento. El mdico tambin podr darle indicaciones ms especficas. Comunquese con el mdico si tiene problemas o preguntas. Qu puedo esperar despus del procedimiento? Despus del procedimiento, es comn [employer] siguientes sntomas:  Una pequea cantidad de sangre en la orina durante hasta 2semanas.  Molestias o dolor leve debido al catter. Despus de que le retiren Nurse, mental health, puede sentir [employer], especialmente al [employer].  Dolor en la zona inferior del abdomen. Siga estas indicaciones en su casa: Medicamentos   [employer] de venta libre y los recetados solamente como se lo haya indicado el mdico.  Si le recetaron un antibitico, tmelo como se lo haya indicado el mdico. No deje de tomar el antibitico aunque comience a sentirse mejor.  No conduzca durante 24horas si le administraron un sedante durante el procedimiento.  Pregntele al mdico si el medicamento que le recet: ? Hace que sea necesario no conducir ni usar maquinaria pesada. ? Puede causarle estreimiento. Es  posible que tenga que tomar estas medidas para prevenir o tratar el estreimiento:  Tomar medicamentos recetados o de [employer].  Consumir alimentos ricos en fibra, como frijoles, cereales integrales, y frutas y verduras frescas.  Limitar su consumo de alimentos ricos en grasa y azcares procesados, como los alimentos fritos o dulces. Actividad  Retome sus actividades normales como se lo haya indicado el mdico. Pregntele al mdico qu actividades son seguras para usted.  No levante ningn objeto que pese ms de 10libras (4.5kg) o que supere el lmite de peso que le hayan indicado, hasta que el mdico le diga que puede [place].  Evite la actividad fsica intensa durante el tiempo que le haya indicado el mdico.  Haga reposo como se lo haya indicado el mdico.  Evite estar sentado durante largos perodos sin moverse. Levntese y camine un poco cada 1 a 2 horas. Esto es importante para mejorar el flujo sanguneo y la respiracin. Pida ayuda si se siente dbil o inestable. Indicaciones generales   No beba alcohol durante el tiempo que le haya indicado el mdico. Esto es muy importante si toma analgsicos recetados.  No tome baos de inmersin, no nade ni use el jacuzzi hasta que el mdico lo autorice. Pregntele al mdico si puede ducharse. Thurston Pounds  solo le permitan darse baos de [place].  Si tiene Science writer, siga las indicaciones del mdico sobre el cuidado del catter y de la bolsa de drenaje.  Beba suficiente lquido como para Theatre manager la orina de color amarillo plido.  Use medias de compresin como se lo haya indicado su mdico. Estas medias ayudan a evitar la formacin de cogulos de [place] y a reducir la hinchazn de las piernas.  Concurra a todas las visitas de seguimiento como se lo haya indicado el mdico. Esto es importante. ? Se le deber hacer un seguimiento riguroso con controles peridicos de la vejiga y Geologist, engineering (cistoscopias) para asegurarse de que el cncer no haya regresado. Comunquese con un mdico si:  Tiene un dolor que empeora o que no mejora con los medicamentos.  Tiene sangre en la orina durante ms de 2semanas.  La orina es turbia o tiene mal olor.  Tiene estreimiento. Entre los signos de estreimiento, se incluyen los siguientes: ? Defecar menos de tres veces por semana. ? Tener dificultades para defecar. ? Tener las heces secas y duras, o ms grandes que lo normal.  Irene Shipper. Solicite ayuda inmediatamente si:  Tiene lo siguiente: ? Dolor intenso. ? Sangre de color rojo brillante en la orina. ? Cogulos de [employer]. ? Mucha sangre en la orina.  Le han retirado el catter y no puede Garment/textile technologist.  Tiene colocado un catter, pero este no drena la orina. Resumen  Despus del procedimiento, es comn tener una pequea cantidad de sangre en la orina, sensibilidad o dolor leve debido al catter y Social research officer, government en la zona inferior del abdomen.  Tome los medicamentos de venta libre y los recetados solamente como se lo haya indicado el mdico.  Haga reposo como se lo haya indicado el [place] indicaciones del mdico respecto de cmo retomar sus actividades habituales. Pregunte qu actividades son seguras para usted.  Si tiene Science writer, siga las indicaciones del mdico sobre el cuidado  del catter y de la bolsa de drenaje.  Solicite ayuda de inmediato si no puede orinar, si tiene dolor intenso o si hay sangre de color rojo brillante o cogulos de [employer]. Esta informacin  no tiene Marine scientist el consejo del mdico. Asegrese de hacerle al mdico cualquier pregunta que tenga. Document Released: 10/08/2015 Document Revised: 02/18/2018 Document Reviewed: 02/18/2018 Elsevier Interactive Patient Education  2019 [employer].

## 2018-12-22 ENCOUNTER — Encounter: Payer: Self-pay | Admitting: Urology

## 2018-12-24 LAB — SURGICAL PATHOLOGY

## 2018-12-28 NOTE — Anesthesia Postprocedure Evaluation (Signed)
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) GREEN LIGHT LASER TURP (TRANSURETHRAL RESECTION OF PROSTATE (N/A Prostate) MITOMYCIN C APPLICATION (N/A Bladder)  Patient location during evaluation: PACU Anesthesia Type: General Level of consciousness: awake and alert Pain management: pain level controlled Vital Signs Assessment: post-procedure vital signs reviewed and stable Respiratory status: spontaneous breathing, nonlabored ventilation, respiratory function stable and patient connected to nasal cannula oxygen Cardiovascular status: blood pressure returned to baseline and stable Postop Assessment: no apparent nausea or vomiting Anesthetic complications: no     Last Vitals:  Vitals:   12/21/18 1525 12/21/18 1556  BP: 132/72 130/89  Pulse: 100 82  Resp: 18 16  Temp: (!) 35.8 C   SpO2: 97% 97%    Last Pain:  Vitals:   12/22/18 0852  TempSrc:   PainSc: 0-No pain                 Alphonsus Sias

## 2019-03-28 ENCOUNTER — Other Ambulatory Visit: Payer: Self-pay

## 2019-03-28 DIAGNOSIS — Z20822 Contact with and (suspected) exposure to covid-19: Secondary | ICD-10-CM

## 2019-03-29 LAB — NOVEL CORONAVIRUS, NAA: SARS-CoV-2, NAA: NOT DETECTED

## 2020-02-08 DIAGNOSIS — Z95810 Presence of automatic (implantable) cardiac defibrillator: Secondary | ICD-10-CM

## 2020-02-08 HISTORY — PX: ICD IMPLANT: EP1208

## 2020-02-08 HISTORY — DX: Presence of automatic (implantable) cardiac defibrillator: Z95.810

## 2020-07-19 ENCOUNTER — Other Ambulatory Visit: Payer: Self-pay | Admitting: Urology

## 2020-07-19 DIAGNOSIS — R3121 Asymptomatic microscopic hematuria: Secondary | ICD-10-CM

## 2020-08-01 ENCOUNTER — Ambulatory Visit
Admission: RE | Admit: 2020-08-01 | Discharge: 2020-08-01 | Disposition: A | Discharge status: Home / Self Care | Payer: Medicare HMO | Source: Ambulatory Visit | Attending: Urology | Admitting: Urology

## 2020-08-01 ENCOUNTER — Other Ambulatory Visit: Payer: Self-pay

## 2020-08-01 DIAGNOSIS — R3121 Asymptomatic microscopic hematuria: Secondary | ICD-10-CM | POA: Diagnosis present

## 2020-08-01 LAB — POCT I-STAT CREATININE: Creatinine, Ser: 0.9 mg/dL (ref 0.61–1.24)

## 2020-08-01 MED ORDER — IOHEXOL 300 MG/ML  SOLN
100.0000 mL | Freq: Once | INTRAMUSCULAR | Status: AC | PRN
  Administered 2020-08-01: 100 mL via INTRAVENOUS

## 2021-04-02 ENCOUNTER — Ambulatory Visit: Payer: Self-pay | Admitting: General Surgery

## 2021-04-02 NOTE — H&P (Signed)
Marks PROFILE: Kevin Marks is a 80 y.o. male who presents to [facility] for consultation at [facility] of Dr. Lovie Marks for evaluation of umbilical hernia.  PCP:  Kevin Nancy, MD  HISTORY OF PRESENT ILLNESS: Kevin Marks reports having a hernia for many years.  He endorses he has some discomfort and mild pain on Kevin Marks.  Pain localized to Kevin Marks.  There is no pain radiation.  Pain aggravated by certain abdominal wall movement.  He also endorses that he has been increasing in size in Kevin last year.  He feels that Kevin Marks is getting bigger.  Marks denies any abdominal distention nausea or vomiting.  Marks denies any previous surgeries on Kevin Marks.  Marks endorses having regular bowel movements.   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   Status post total knee replacement, left 11/11/2016   Essential hypertension 7/84/6962   Umbilical hernia without obstruction and without gangrene 05/14/2016   Primary osteoarthritis of left knee 11/22/2015   Hypercholesterolemia 01/18/2014   Erectile dysfunction 01/18/2014   GERD (gastroesophageal reflux disease) 01/18/2014   Hyperglycemia 01/18/2014       GENERAL REVIEW OF SYSTEMS:   General ROS: negative for - chills, fatigue, fever, weight gain or weight loss Allergy and Immunology ROS: negative for - hives   Hematological and Lymphatic ROS: negative for - bleeding problems or bruising, negative for palpable nodes Endocrine ROS: negative for - heat or cold intolerance, hair changes Respiratory ROS: negative for - cough, shortness of breath or wheezing Cardiovascular ROS: no chest pain or palpitations.  Positive for irregular heartbeat GI ROS: negative for nausea, vomiting, abdominal pain, diarrhea, positive for constipation Musculoskeletal ROS: negative for - joint swelling or muscle pain Neurological ROS: negative for - confusion, syncope Dermatological ROS: negative for pruritus and rash Psychiatric: negative for anxiety, depression, difficulty sleeping and memory loss  MEDICATIONS: Current Outpatient Medications  Medication Sig Dispense Refill   acetaminophen (TYLENOL) 500 MG tablet Take 1,000 mg by mouth every 6 (six) hours as needed for Pain        amoxicillin (AMOXIL) 500 MG capsule Take 2,000 mg by mouth as needed (prior to dental visits)        apixaban (ELIQUIS) 5 mg tablet Take 1 tablet (5 mg total) by mouth 2 (two) times daily 180 tablet 3   atorvastatin (LIPITOR) 80 MG tablet Take 1 tablet (80 mg total) by mouth nightly 90 tablet 3   cyclobenzaprine (FLEXERIL) 5 MG tablet Take 1 tablet (5 mg total) by mouth 2 (two) times daily as needed for Muscle spasms 90 tablet 0   FARXIGA 10 mg tablet TAKE 1 TABLET BY MOUTH EVERY MORNING 90 tablet 1   metoprolol succinate (TOPROL-XL) 100 MG XL tablet TAKE 1 TABLET BY MOUTH TWICE A DAY 360 tablet 1   potassium  chloride (KLOR-CON) 10 MEQ ER tablet TAKE 1 TABLET BY MOUTH 2 TIMES DAILY. 180 tablet 3   sacubitriL-valsartan (ENTRESTO) 49-51 mg tablet Take 1 tablet by mouth 2 (two) times daily 180 tablet 3   tamsulosin (FLOMAX) 0.4 mg capsule Take 0.4 mg by mouth once daily     VITAMIN D3 ORAL Take 1,000 Units by mouth once daily     ZINC ORAL Take 50 mg by mouth once daily     No current facility-administered medications for this visit.     ALLERGIES: Marks has no known allergies.  PAST MEDICAL HISTORY: Past Medical History:  Diagnosis Date   Allergic state    Arthritis    Atrial fibrillation (CMS-HCC)    Dyspepsia    Erectile dysfunction    GERD (gastroesophageal reflux disease)    History of Marks polyps    Adenomatous   Hypercholesterolemia    Hyperglycemia    Hypertension    Osteoarthritis     PAST SURGICAL HISTORY: Past Surgical History:  Procedure Laterality Date   COLONOSCOPY  04/03/1995   Adenomatous Polyps   COLONOSCOPY  03/03/2000   Adenomatous Polyp   COLONOSCOPY  05/10/2002   PH Adenomatous Polyps   COLONOSCOPY  12/21/2008   PH Adenomatous Polyps   COLONOSCOPY  02/20/2014   PH Adenomatous Polyps: CBF 01/2019   COLONOSCOPY  04/05/2018   Tubular adenoma of Kevin Marks   COLONOSCOPY  04/05/2018   Adenomatous Polyps: CBF 03/2021   EGD  09/30/1996   EGD  12/05/1996   JOINT REPLACEMENT Left 10/27/2016   Total Makoplasty   OPEN EXCISION BREAST LESION Left 09/26/12   Breast lipoma removed, Dr. Fleet Marks   TONSILLECTOMY     TRANSURETHRAL MICROWAVE THERAPY     of Kevin Marks for BPH     FAMILY HISTORY: Family History  Problem Relation Age of Onset   Cancer Father        Bladder   Anesthesia problems Neg Hx    Malignant hypertension Neg Hx    Malignant hyperthermia Neg Hx    Pseudochol deficiency Neg Hx    PONV Neg Hx      SOCIAL HISTORY: Social History   Socioeconomic History   Marital status: Married    Spouse name: Psychologist, forensic  Tobacco Use   Smoking status: Former Smoker    Packs/day: 1.00    Years: 40.00    Pack years: 40.00    Quit date: 1970    Years since quitting: 52.7   Smokeless tobacco: Never Used  Scientific laboratory technician Use: Never used  Substance and Sexual Activity   Alcohol use: No   Drug use: Never   Sexual activity: Yes    Partners: Female  Social History Narrative   Exercise:  Active at work.   Diet:  Red meat three times a week.  Fast foods five  times a week.  Fried foods three times a week.    PHYSICAL EXAM: Vitals:   04/02/21 0834  BP: (!) 152/87  Pulse: 78   Body mass index is 27.67 kg/m. Weight: 82.6 kg (182 lb)   GENERAL: Alert, active, oriented x3  HEENT: Pupils equal reactive to light. Extraocular movements are intact. Sclera clear. Palpebral conjunctiva normal red color.Pharynx clear.  NECK: Supple with no palpable mass and no adenopathy.  LUNGS: Sound clear with no rales rhonchi or wheezes.  HEART: Regular rhythm S1 and S2 without murmur.  ABDOMEN: Soft and depressible, nontender with no palpable mass, no hepatomegaly.  Medium sized reducible umbilical hernia.  Mild tender to reduction.  EXTREMITIES: Well-developed well-nourished symmetrical with no dependent edema.  NEUROLOGICAL: Awake alert oriented, facial expression symmetrical, moving all extremities.  REVIEW OF DATA: I have reviewed Kevin following data today: Office Visit on 04/01/2021  Component Date Value   WBC (White Blood Cell Co* 04/01/2021 6.7    RBC (Red Blood Cell Coun* 04/01/2021 5.37    Hemoglobin 04/01/2021 16.5    Hematocrit 04/01/2021 49.2    MCV (Mean Corpuscular Vo* 04/01/2021 91.6    MCH (Mean Corpuscular He* 04/01/2021 30.7    MCHC (Mean Corpuscular H* 04/01/2021 33.5    Platelet Count 04/01/2021 135 (!)   RDW-CV (Red Cell Distrib* 04/01/2021 12.9    MPV (Mean Platelet Volum* 04/01/2021 10.8    Neutrophils 04/01/2021 4.60    Lymphocytes 04/01/2021 1.70    Mixed Count 04/01/2021 0.40    Neutrophil % 04/01/2021 68.0    Lymphocyte % 04/01/2021 25.8    Mixed % 04/01/2021 6.2    Glucose 04/01/2021 89    Sodium 04/01/2021 142    Potassium 04/01/2021 3.8    Chloride 04/01/2021 105    Carbon Dioxide (CO2) 04/01/2021 27.7    Urea Nitrogen (BUN) 04/01/2021 13    Creatinine 04/01/2021 0.8    Glomerular Filtration Ra* 04/01/2021 93    Calcium 04/01/2021 9.0    AST  04/01/2021 20    ALT  04/01/2021 22    Alk Phos (alkaline Phosp*  04/01/2021 82    Albumin 04/01/2021 4.3    Bilirubin, Total 04/01/2021 1.3 (!)   Protein, Total 04/01/2021 6.3    A/G Ratio 04/01/2021 2.2    PSA (Marks Specific A* 04/01/2021 0.77    Hemoglobin A1C 04/01/2021 6.0 (!)   Average Blood Glucose (C* 04/01/2021 126    Color 04/01/2021 Yellow    Clarity 04/01/2021 Clear    Specific Gravity 04/01/2021 1.020    pH, Urine 04/01/2021 5.5    Protein, Urinalysis 04/01/2021 Trace    Glucose, Urinalysis 04/01/2021 >=1000 (!)   Ketones, Urinalysis 04/01/2021 15  (!)   Blood, Urinalysis 04/01/2021 Small (!)   Nitrite, Urinalysis 04/01/2021 Negative    Leukocyte Esterase, Urin* 04/01/2021 Negative    White Blood Cells, Urina* 04/01/2021 None Seen    Red Blood Cells, Urinaly* 04/01/2021 0-3    Bacteria, Urinalysis 04/01/2021 None Seen    Squamous Epithelial Cell* 04/01/2021 None Seen      ASSESSMENT: Kevin Marks is a 80 y.o. male presenting for consultation for umbilical hernia.    Kevin Marks presents with a symptomatic umbilical hernia. Marks was oriented about Kevin diagnosis of umbilical hernia and its implication. Kevin Marks was oriented about Kevin treatment alternatives (observation vs surgical repair). Due to Marks symptoms, repair is recommended. Marks oriented about Kevin surgical procedure, Kevin use of mesh and its risk of complications such as: infection, bleeding, injury to vasculature, injury to bowel or bladder, and chronic pain, intestinal obstruction, among others.   Umbilical hernia without obstruction and without gangrene [K42.9]  PLAN: Robotic assisted laparoscopic umbilical hernia repair with mesh ([id number]) CBC, CMP Cardiac clearance Avoid taking aspirin 5 days before Kevin procedure Contact us if you have any concern.   Marks verbalized understanding, all questions were answered, and were agreeable with Kevin plan outlined above.    Herbert Pun, MD  Electronically signed by Herbert Pun, MD

## 2021-04-02 NOTE — H&P (View-Only) (Signed)
PATIENT PROFILE: Jaimon L Feltz is a 80 y.o. male who presents to the Clinic for consultation at the request of Dr. Bronstein for evaluation of umbilical hernia.  PCP:  Bronstein, David Marvin, MD  HISTORY OF PRESENT ILLNESS: Mr. Mccune reports having a hernia for many years.  He endorses he has some discomfort and mild pain on the umbilical area.  Pain localized to the midfoot area.  There is no pain radiation.  Pain aggravated by certain abdominal wall movement.  He also endorses that he has been increasing in size in the last year.  He feels that the lump is getting bigger.  Patient denies any abdominal distention nausea or vomiting.  Patient denies any previous surgeries on the umbilical area.  Patient endorses having regular bowel movements.   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   Status post total knee replacement, left 11/11/2016   Essential hypertension 08/22/2016   Umbilical hernia without obstruction and without gangrene 05/14/2016   Primary osteoarthritis of left knee 11/22/2015   Hypercholesterolemia 01/18/2014   Erectile dysfunction 01/18/2014   GERD (gastroesophageal reflux disease) 01/18/2014   Hyperglycemia 01/18/2014       GENERAL REVIEW OF SYSTEMS:   General ROS: negative for - chills, fatigue, fever, weight gain or weight loss Allergy and Immunology ROS: negative for - hives   Hematological and Lymphatic ROS: negative for - bleeding problems or bruising, negative for palpable nodes Endocrine ROS: negative for - heat or cold intolerance, hair changes Respiratory ROS: negative for - cough, shortness of breath or wheezing Cardiovascular ROS: no chest pain or palpitations.  Positive for irregular heartbeat GI ROS: negative for nausea, vomiting, abdominal pain, diarrhea, positive for constipation Musculoskeletal ROS: negative for - joint swelling or muscle pain Neurological ROS: negative for - confusion, syncope Dermatological ROS: negative for pruritus and rash Psychiatric: negative for anxiety, depression, difficulty sleeping and memory loss  MEDICATIONS: Current Outpatient Medications  Medication Sig Dispense Refill   acetaminophen (TYLENOL) 500 MG tablet Take 1,000 mg by mouth every 6 (six) hours as needed for Pain        amoxicillin (AMOXIL) 500 MG capsule Take 2,000 mg by mouth as needed (prior to dental visits)        apixaban (ELIQUIS) 5 mg tablet Take 1 tablet (5 mg total) by mouth 2 (two) times daily 180 tablet 3   atorvastatin (LIPITOR) 80 MG tablet Take 1 tablet (80 mg total) by mouth nightly 90 tablet 3   cyclobenzaprine (FLEXERIL) 5 MG tablet Take 1 tablet (5 mg total) by mouth 2 (two) times daily as needed for Muscle spasms 90 tablet 0   FARXIGA 10 mg tablet TAKE 1 TABLET BY MOUTH EVERY MORNING 90 tablet 1   metoprolol succinate (TOPROL-XL) 100 MG XL tablet TAKE 1 TABLET BY MOUTH TWICE A DAY 360 tablet 1   potassium   chloride (KLOR-CON) 10 MEQ ER tablet TAKE 1 TABLET BY MOUTH 2 TIMES DAILY. 180 tablet 3   sacubitriL-valsartan (ENTRESTO) 49-51 mg tablet Take 1 tablet by mouth 2 (two) times daily 180 tablet 3   tamsulosin (FLOMAX) 0.4 mg capsule Take 0.4 mg by mouth once daily     VITAMIN D3 ORAL Take 1,000 Units by mouth once daily     ZINC ORAL Take 50 mg by mouth once daily     No current facility-administered medications for this visit.     ALLERGIES: Patient has no known allergies.  PAST MEDICAL HISTORY: Past Medical History:  Diagnosis Date   Allergic state    Arthritis    Atrial fibrillation (CMS-HCC)    Dyspepsia    Erectile dysfunction    GERD (gastroesophageal reflux disease)    History of colon polyps    Adenomatous   Hypercholesterolemia    Hyperglycemia    Hypertension    Osteoarthritis     PAST SURGICAL HISTORY: Past Surgical History:  Procedure Laterality Date   COLONOSCOPY  04/03/1995   Adenomatous Polyps   COLONOSCOPY  03/03/2000   Adenomatous Polyp   COLONOSCOPY  05/10/2002   PH Adenomatous Polyps   COLONOSCOPY  12/21/2008   PH Adenomatous Polyps   COLONOSCOPY  02/20/2014   PH Adenomatous Polyps: CBF 01/2019   COLONOSCOPY  04/05/2018   Tubular adenoma of the colon   COLONOSCOPY  04/05/2018   Adenomatous Polyps: CBF 03/2021   EGD  09/30/1996   EGD  12/05/1996   JOINT REPLACEMENT Left 10/27/2016   Total Makoplasty   OPEN EXCISION BREAST LESION Left 09/26/12   Breast lipoma removed, Dr. Fleet Contras   TONSILLECTOMY     TRANSURETHRAL MICROWAVE THERAPY     of the prostate for BPH     FAMILY HISTORY: Family History  Problem Relation Age of Onset   Cancer Father        Bladder   Anesthesia problems Neg Hx    Malignant hypertension Neg Hx    Malignant hyperthermia Neg Hx    Pseudochol deficiency Neg Hx    PONV Neg Hx      SOCIAL HISTORY: Social History   Socioeconomic History   Marital status: Married    Spouse name: Psychologist, forensic  Tobacco Use   Smoking status: Former Smoker    Packs/day: 1.00    Years: 40.00    Pack years: 40.00    Quit date: 1970    Years since quitting: 52.7   Smokeless tobacco: Never Used  Scientific laboratory technician Use: Never used  Substance and Sexual Activity   Alcohol use: No   Drug use: Never   Sexual activity: Yes    Partners: Female  Social History Narrative   Exercise:  Active at work.   Diet:  Red meat three times a week.  Fast foods five  times a week.  Fried foods three times a week.    PHYSICAL EXAM: Vitals:   04/02/21 0834  BP: (!) 152/87  Pulse: 78   Body mass index is 27.67 kg/m. Weight: 82.6 kg (182 lb)   GENERAL: Alert, active, oriented x3  HEENT: Pupils equal reactive to light. Extraocular movements are intact. Sclera clear. Palpebral conjunctiva normal red color.Pharynx clear.  NECK: Supple with no palpable mass and no adenopathy.  LUNGS: Sound clear with no rales rhonchi or wheezes.  HEART: Regular rhythm S1 and S2 without murmur.  ABDOMEN: Soft and depressible, nontender with no palpable mass, no hepatomegaly.  Medium sized reducible umbilical hernia.  Mild tender to reduction.  EXTREMITIES: Well-developed well-nourished symmetrical with no dependent edema.  NEUROLOGICAL: Awake alert oriented, facial expression symmetrical, moving all extremities.  REVIEW OF DATA: I have reviewed the following data today: Office Visit on 04/01/2021  Component Date Value   WBC (White Blood Cell Co* 04/01/2021 6.7    RBC (Red Blood Cell Coun* 04/01/2021 5.37    Hemoglobin 04/01/2021 16.5    Hematocrit 04/01/2021 49.2    MCV (Mean Corpuscular Vo* 04/01/2021 91.6    MCH (Mean Corpuscular He* 04/01/2021 30.7    MCHC (Mean Corpuscular H* 04/01/2021 33.5    Platelet Count 04/01/2021 135 (!)   RDW-CV (Red Cell Distrib* 04/01/2021 12.9    MPV (Mean Platelet Volum* 04/01/2021 10.8    Neutrophils 04/01/2021 4.60    Lymphocytes 04/01/2021 1.70    Mixed Count 04/01/2021 0.40    Neutrophil % 04/01/2021 68.0    Lymphocyte % 04/01/2021 25.8    Mixed % 04/01/2021 6.2    Glucose 04/01/2021 89    Sodium 04/01/2021 142    Potassium 04/01/2021 3.8    Chloride 04/01/2021 105    Carbon Dioxide (CO2) 04/01/2021 27.7    Urea Nitrogen (BUN) 04/01/2021 13    Creatinine 04/01/2021 0.8    Glomerular Filtration Ra* 04/01/2021 93    Calcium 04/01/2021 9.0    AST  04/01/2021 20    ALT  04/01/2021 22    Alk Phos (alkaline Phosp*  04/01/2021 82    Albumin 04/01/2021 4.3    Bilirubin, Total 04/01/2021 1.3 (!)   Protein, Total 04/01/2021 6.3    A/G Ratio 04/01/2021 2.2    PSA (Prostate Specific A* 04/01/2021 0.77    Hemoglobin A1C 04/01/2021 6.0 (!)   Average Blood Glucose (C* 04/01/2021 126    Color 04/01/2021 Yellow    Clarity 04/01/2021 Clear    Specific Gravity 04/01/2021 1.020    pH, Urine 04/01/2021 5.5    Protein, Urinalysis 04/01/2021 Trace    Glucose, Urinalysis 04/01/2021 >=1000 (!)   Ketones, Urinalysis 04/01/2021 15  (!)   Blood, Urinalysis 04/01/2021 Small (!)   Nitrite, Urinalysis 04/01/2021 Negative    Leukocyte Esterase, Urin* 04/01/2021 Negative    White Blood Cells, Urina* 04/01/2021 None Seen    Red Blood Cells, Urinaly* 04/01/2021 0-3    Bacteria, Urinalysis 04/01/2021 None Seen    Squamous Epithelial Cell* 04/01/2021 None Seen      ASSESSMENT: Mr. Sahagian is a 80 y.o. male presenting for consultation for umbilical hernia.    The patient presents with a symptomatic umbilical hernia. Patient was oriented about the diagnosis of umbilical hernia and its implication. The patient was oriented about the treatment alternatives (observation vs surgical repair). Due to patient symptoms, repair is recommended. Patient oriented about the surgical procedure, the use of mesh and its risk of complications such as: infection, bleeding, injury to vasculature, injury to bowel or bladder, and chronic pain, intestinal obstruction, among others.   Umbilical hernia without obstruction and without gangrene [K42.9]  PLAN: Robotic assisted laparoscopic umbilical hernia repair with mesh ([id number]) CBC, CMP Cardiac clearance Avoid taking aspirin 5 days before the procedure Contact us if you have any concern.   Patient verbalized understanding, all questions were answered, and were agreeable with the plan outlined above.    Herbert Pun, MD  Electronically signed by Herbert Pun, MD

## 2021-04-04 ENCOUNTER — Other Ambulatory Visit
Admission: RE | Admit: 2021-04-04 | Discharge: 2021-04-04 | Disposition: A | Discharge status: Home / Self Care | Payer: Medicare HMO | Source: Ambulatory Visit | Attending: General Surgery | Admitting: General Surgery

## 2021-04-04 ENCOUNTER — Other Ambulatory Visit: Payer: Self-pay

## 2021-04-04 HISTORY — DX: Cardiac arrhythmia, unspecified: I49.9

## 2021-04-04 HISTORY — DX: Depression, unspecified: F32.A

## 2021-04-04 HISTORY — DX: Personal history of urinary calculi: Z87.442

## 2021-04-04 HISTORY — DX: Malignant (primary) neoplasm, unspecified: C80.1

## 2021-04-04 HISTORY — DX: Presence of cardiac pacemaker: Z95.0

## 2021-04-04 NOTE — Patient Instructions (Addendum)
Your procedure is scheduled on: 04/08/21  Report to the Registration Desk on the 1st floor of the [facility]. To find out your arrival time, please call [phone] between 1PM - 3PM on: 04/05/21   REMEMBER: Instructions that are not followed completely may result in serious medical risk, up to and including death; or upon the discretion of your surgeon and anesthesiologist your surgery may need to be rescheduled.  Do not eat food after midnight the night before surgery.  No gum chewing, lozengers or hard candies.  You may however, drink CLEAR liquids up to 2 hours before you are scheduled to arrive for your surgery. Do not drink anything within 2 hours of your scheduled arrival time.  Clear liquids include: - water  - apple juice without pulp - gatorade (not RED, PURPLE, OR BLUE) - black coffee or tea (Do NOT add milk or creamers to the coffee or tea) Do NOT drink anything that is not on this list.  TAKE THESE MEDICATIONS THE MORNING OF SURGERY WITH A SIP OF WATER:  - amoxicillin (AMOXIL) 500 MG capsule - metoprolol succinate (TOPROL-XL) 100 MG 24 hr tablet - acetaminophen-codeine (TYLENOL #3) 300-30 MG tablet if needed.  Follow recommendations from Cardiologist, Pulmonologist or PCP regarding stopping Aspirin, Coumadin, Plavix, Eliquis, Pradaxa, or Pletal. Stop taking beginning 04/06/21 per MD order- Peyton Najjar Ferrel Logan.  Stop taking FARXIGA 10 MG TABS tablet beginning 04/05/21, may resume after surgery.  One week prior to surgery: Stop Anti-inflammatories (NSAIDS) such as Advil, Aleve, Ibuprofen, Motrin, Naproxen, Naprosyn and Aspirin based products such as Excedrin, Goodys Powder, BC Powder.  Stop ANY OVER THE COUNTER supplements until after surgery: zinc gluconate 50 MG tablet  You may however, continue to take Tylenol if needed for pain up until the day of surgery.  No Alcohol for 24 hours before or after surgery.  No Smoking including e-cigarettes for 24 hours prior to  surgery.  No chewable tobacco products for at least 6 hours prior to surgery.  No nicotine patches on the day of surgery.  Do not use any "recreational" drugs for at least a week prior to your surgery.  Please be advised that the combination of cocaine and anesthesia may have negative outcomes, up to and including death. If you test positive for cocaine, your surgery will be cancelled.  On the morning of surgery brush your teeth with toothpaste and water, you may rinse your mouth with mouthwash if you wish. Do not swallow any toothpaste or mouthwash.  Use CHG Soap or wipes as directed on instruction sheet.  Do not wear jewelry, make-up, hairpins, clips or nail polish.  Do not wear lotions, powders, or perfumes.   Do not shave body from the neck down 48 hours prior to surgery just in case you cut yourself which could leave a site for infection.  Also, freshly shaved skin may become irritated if using the CHG soap.  Contact lenses, hearing aids and dentures may not be worn into surgery.  Do not bring valuables to the hospital. [facility] is not responsible for any missing/lost belongings or valuables.   Notify your doctor if there is any change in your medical condition (cold, fever, infection).  Wear comfortable clothing (specific to your surgery type) to the hospital.  After surgery, you can help prevent lung complications by doing breathing exercises.  Take deep breaths and cough every 1-2 hours. Your doctor may order a device called an Incentive Spirometer to help you take deep breaths. When  coughing or sneezing, hold a pillow firmly against your incision with both hands. This is called "splinting." Doing this helps protect your incision. It also decreases belly discomfort.  If you are being admitted to the hospital overnight, leave your suitcase in the car. After surgery it may be brought to your room.  If you are being discharged the day of surgery, you will not be allowed to  drive home. You will need a responsible adult (18 years or older) to drive you home and stay with you that night.   If you are taking public transportation, you will need to have a responsible adult (18 years or older) with you. Please confirm with your physician that it is acceptable to use public transportation.   Please call the [facility] Dept. at [phone] if you have any questions about these instructions.  Surgery Visitation Policy:  Patients undergoing a surgery or procedure may have one family member or support person with them as long as that person is not COVID-19 positive or experiencing its symptoms.  That person may remain in the waiting area during the procedure and may rotate out with other people.  Inpatient Visitation:    Visiting hours are 7 a.m. to 8 p.m. Up to two visitors ages 16+ are allowed at one time in a patient room. The visitors may rotate out with other people during the day. Visitors must check out when they leave, or other visitors will not be allowed. One designated support person may remain overnight. The visitor must pass COVID-19 screenings, use hand sanitizer when entering and exiting the patient's room and wear a mask at all times, including in the patient's room. Patients must also wear a mask when staff or their visitor are in the room. Masking is required regardless of vaccination status.

## 2021-04-05 ENCOUNTER — Encounter: Payer: Self-pay | Admitting: General Surgery

## 2021-04-05 ENCOUNTER — Encounter
Admission: RE | Admit: 2021-04-05 | Discharge: 2021-04-05 | Disposition: A | Discharge status: Home / Self Care | Payer: Medicare HMO | Source: Ambulatory Visit | Attending: General Surgery | Admitting: General Surgery

## 2021-04-05 DIAGNOSIS — Z01810 Encounter for preprocedural cardiovascular examination: Secondary | ICD-10-CM | POA: Diagnosis not present

## 2021-04-05 NOTE — Progress Notes (Signed)
Perioperative Services  Pre-Admission/Anesthesia Testing Clinical Review  Date: 04/05/21  Patient Demographics:  Name: Kevin Marks DOB:   [birth year] MRN:   [id number]  Planned Surgical Procedure(s):    Case: [id number] Date/Time: 04/08/21 1044   Procedure: XI ROBOT ASSISTED UMBILICAL HERNIA REPAIR (Abdomen)   Anesthesia type: General   Pre-op diagnosis: H54.5 Umbilical hernia w/o obstruction or gangrene   Location: [facility] OR ROOM 07 / [facility] ORS FOR ANESTHESIA GROUP   Surgeons: Herbert Pun, MD     NOTE: Available PAT nursing documentation and vital signs have been reviewed. Clinical nursing staff has updated patient's PMH/PSHx, current medication list, and drug allergies/intolerances to ensure comprehensive history available to assist in medical decision making as it pertains to the aforementioned surgical procedure and anticipated anesthetic course. Extensive review of available clinical information performed. [place] PMH and PSHx updated with any diagnoses/procedures that  may have been inadvertently omitted during his intake with the pre-admission testing department's nursing staff.  Clinical Discussion:  Kevin Marks is a 80 y.o. male who is submitted for pre-surgical anesthesia review and clearance prior to him undergoing the above procedure. Patient is a Former Smoker (40 pack years). Pertinent PMH includes: CAD, MI, atrial fibrillation, HFrEF, PAH, valvular regurgitation, aortic atherosclerosis, HTN, HLD, pre-diabetes, GERD (no daily Tx), OA, BPH, depression.  Patient is followed by cardiology Nehemiah Massed, MD). He was last seen in the cardiology clinic on 01/22/2021; notes reviewed.  At the time of his clinic visit, patient doing well overall from a cardiovascular perspective.  He denied any episodes of chest pain or significant shortness of breath.  He has not experienced any PND, orthopnea, palpitations, significant peripheral edema, or presyncope/syncope.  Patient  complaining of some mild vertiginous symptoms associated with short distance ambulation. PMH significant for cardiovascular diagnoses.  Patient reports history of MI, however is unable to advise as to when cardiac event occurred.  I do not see evidence of this and notes from his primary cardiologist.  Patient underwent diagnostic left heart catheterization on 08/20/2004 that revealed insignificant nonobstructive CAD. There were 2 areas of 20% stenoses within the proximal LAD, 20% stenosis in the mid LAD, 10% stenosis in the proximal LCx, 20% stenosis in the mid LCx, and 25% stenosis in the proximal RCA.  No interventions were required.  Cardiology elected to treat medically.  Repeat diagnostic heart catheterization was performed on 09/13/2012 that was grossly unchanged from previous.  Patient underwent TTE on 01/23/2015 revealing a severely reduced left ventricular systolic function with an EF of 35%. There was moderate left atrial dilatation.  Mild PAH noted.  There was trivial aortic, mild tricuspid and pulmonic, and moderate mitral valve regurgitation.  Patient has undergone serial monitoring of his cardiac function via TTE. Last TTE was performed on 06/27/2020 revealing severe left ventricular systolic dysfunction with an EF of 25-30%.  Myocardial perfusion imaging study performed on 04/28/2018 revealed and LVEF of 49%.  There were no regional wall motion abnormalities.  There was no evidence of stress-induced myocardial ischemia or arrhythmia.  Patient underwent placement of a [place] on 02/08/2020.  PMH significant for AN atrial fibrillation diagnosis; CHA2DS2-VASc Score = 5 (age x 2, CHF, HTN, prior MI).  Rate and rhythm controlled with beta-blocker monotherapy.  Patient is chronically anticoagulated using daily apixaban; compliant with therapy with no evidence of GI bleeding. HFrEF and blood pressure well controlled on currently prescribed beta-blocker and ARB/ANRi therapies.   Patient is on a statin for his HLD.  Additionally patient  is on an SGLT2 for his HFrEF.  Patient  has a pre-diabetes diagnosis; last Hgb A1c was 6.0% when checked on 04/01/2021. . Functional capacity, as defined by DASI, is documented as being >/= 4 METS. No changes were made to his medication regimen.  Patient to follow-up with outpatient cardiology in 6 months or sooner if needed.  Kevin Marks is scheduled for an elective ROBOT ASSISTED UMBILICAL HERNIA REPAIR on 04/08/2021 with Dr. Herbert Pun, MD. Given patient's past medical history significant for cardiovascular diagnoses, presurgical cardiac clearance was sought by the performing surgeon's office and PAT team. "The patient is at the lowest risk possible for perioperative cardiovascular complications with the planned procedure.  The overall risk his procedure is low (<1%).  Currently has no evidence active and/or significant angina and/or congestive heart failure. Patient may proceed to surgery without restriction or need for further cardiovascular testing and an overall LOW risk".  Again, this patient is on daily anticoagulation therapy. He has been instructed on recommendations for holding his apixaban for 3 days prior to his procedure with plans to restart as soon as postoperative bleeding risk felt to be minimized by his attending surgeon. The patient has been instructed that his last dose of his anticoagulant will be on 04/04/2021.  Patient denies previous perioperative complications with anesthesia in the past. In review of the available records, it is noted that patient underwent a MAC + local anesthetic course [facility] (ASA III) in 01/2020 without documented complications.   Vitals with BMI 12/21/2018 12/21/2018 12/21/2018  Height - - -  Weight - - -  BMI - - -  Systolic 803 212 248  Diastolic 89 72 68  Pulse 82 100 104    Providers/Specialists:   NOTE: Primary physician provider listed below. Patient  may have been seen by APP or partner within same practice.   PROVIDER ROLE / SPECIALTY LAST Tanna Savoy, MD GENERAL SURGERY (SURGEON) 04/02/2021  Juluis Pitch, MD PRIMARY CARE PROVIDER 04/01/2021  Serafina Royals, MD CARDIOLOGY 01/22/2021   Allergies:  Patient has no known allergies.  Current Home Medications:   No current facility-administered medications for this encounter.    acetaminophen-codeine (TYLENOL #3) 300-30 MG tablet   amoxicillin (AMOXIL) 500 MG capsule   atorvastatin (LIPITOR) 80 MG tablet   cholecalciferol (VITAMIN D) 25 MCG (1000 UNIT) tablet   cyclobenzaprine (FLEXERIL) 5 MG tablet   ELIQUIS 5 MG TABS tablet   FARXIGA 10 MG TABS tablet   metoprolol succinate (TOPROL-XL) 100 MG 24 hr tablet   potassium chloride (K-DUR,KLOR-CON) 10 MEQ tablet   sacubitril-valsartan (ENTRESTO) 49-51 MG   sodium chloride (OCEAN) 0.65 % SOLN nasal spray   tamsulosin (FLOMAX) 0.4 MG CAPS capsule   zinc gluconate 50 MG tablet   Methen-Hyosc-Meth Blue-Na Phos (UROGESIC-BLUE) 81.6 MG TABS   History:   Past Medical History:  Diagnosis Date   A-fib ([place])    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 b.) LHC 09/13/2012 -- EF 59%; no significant change from previous; no obstructive CAD.   Cancer [facility])    Chronic anticoagulation    a.) Apixaban   Colon polyp    Depression    Dyspepsia    ED (  erectile dysfunction)    GERD (gastroesophageal reflux disease)    HFrEF (heart failure with reduced ejection fraction) ([place])    a.) TTE 01/22/15 - EF 35%; mod LA enlargement. b.) TTE 04/16/18 - EF 35%; mod LA enlargement. c.) TTE 04/28/19 - EF 25-30%; mod LA enlargement. d.) TTE 06/27/20 - EF 25-30%; mod LA enlargement.   History of colon  polyps    History of kidney stones    Hypercholesterolemia    Hypertension    MI, old    PAH (pulmonary artery hypertension) ([place])    a.) TTE 01/22/15 - mild. b.) TTE 04/16/18 - moderate. c.) TTE 04/28/19 - mild.   Valvular regurgitation    a.) TTE 01/22/15 - EF 35%; triv AR, mild TR/PR, mod MR. b.) TTE 04/16/18 - EF 35%; mod TR/PR. c.) TTE 04/28/19 - EF 25-30%; mild MR/PR, mod TR. d.) TTE 06/27/20 - EF 25-30%; mild TR/PR, mod MR.   Past Surgical History:  Procedure Laterality Date   CARDIAC CATHETERIZATION Left 08/20/2004   Procedure: CARDIAC CATHETERIZATION; Location: [facility]; Surgeon: Serafina Royals, MD   CARDIAC CATHETERIZATION Left 09/13/2012   Procedure: CARDIAC CATHETERIZATION; Location: [facility]; Surgeon: Isaias Cowman, MD   COLONOSCOPY  2013   Dr Vira Agar   COLONOSCOPY WITH PROPOFOL N/A 04/05/2018   Procedure: COLONOSCOPY WITH PROPOFOL;  Surgeon: Manya Silvas, MD;  [facility]: [facility] ENDOSCOPY;  Service: Endoscopy;  Laterality: N/A;   GREEN LIGHT LASER TURP (TRANSURETHRAL RESECTION OF PROSTATE N/A 12/21/2018   Procedure: GREEN LIGHT LASER TURP (TRANSURETHRAL RESECTION OF PROSTATE;  Surgeon: Royston Cowper, MD;  Location: [facility] ORS;  Service: Urology;  Laterality: N/A;   ICD IMPLANT N/A 02/08/2020   Procedure: INSJ/RPLCMT PERM DFB W/TRNSVNS LDS 1/DUAL CHMBR; Location: [facility]; Surgeon: Mylinda Latina, MD   JOINT REPLACEMENT Left 2018   left knee replacement     MITOMYCIN C APPLICATION N/A 97/98/9211   Procedure: MITOMYCIN C APPLICATION;  Surgeon: Royston Cowper, MD;  Location: [facility] ORS;  Service: Urology;  Laterality: N/A;   PROSTATE SURGERY  2009   TRANSURETHRAL RESECTION OF BLADDER TUMOR N/A 12/21/2018   Procedure: TRANSURETHRAL RESECTION OF BLADDER TUMOR (TURBT);  Surgeon: Royston Cowper, MD;  Location: [facility] ORS;  Service: Urology;  Laterality: N/A;   Family History  Problem Relation Age of Onset   Alzheimer's disease Mother    Cancer Father    Social History    Tobacco Use   Smoking status: Former    Packs/day: 1.00    Years: 40.00    Pack years: 40.00    Types: Cigarettes   Smokeless tobacco: Never  Vaping Use   Vaping Use: Never used  Substance Use Topics   Alcohol use: No   Drug use: No    Pertinent Clinical Results:  LABS: Labs reviewed: Acceptable for surgery.   Ref Range & Units 04/01/2021  WBC (White Blood Cell Count) 4.1 - 10.2 10^3/uL 6.7   RBC (Red Blood Cell Count) 4.69 - 6.13 10^6/uL 5.37   Hemoglobin 14.1 - 18.1 gm/dL 16.5   Hematocrit 40.0 - 52.0 % 49.2   MCV (Mean Corpuscular Volume) 80.0 - 100.0 fl 91.6   MCH (Mean Corpuscular Hemoglobin) 27.0 - 31.2 pg 30.7   MCHC (Mean Corpuscular Hemoglobin Concentration) 32.0 - 36.0 gm/dL 33.5   Platelet Count 150 - 450 10^3/uL 135 Low    RDW-CV (Red Cell Distribution Width) 11.6 - 14.8 % 12.9   MPV (Mean Platelet Volume) 9.4 - 12.4 fl 10.8   Neutrophils 1.50 - 7.80  10^3/uL 4.60   Lymphocytes 1.00 - 3.60 10^3/uL 1.70   Mixed Count 0.10 - 0.90 10^3/uL 0.40   Neutrophil % 32.0 - 70.0 % 68.0   Lymphocyte % 10.0 - 50.0 % 25.8   Mixed % 3.0 - 14.4 % 6.2   Resulting Agency  [facility] - [facility]  Specimen Collected: 04/01/21 11:14 Last Resulted: 04/01/21 13:36  Received From: [facility]  Result Received: 04/02/21 07:42    Ref Range & Units 04/01/2021  Glucose 70 - 110 mg/dL 89   Sodium 136 - 145 mmol/L 142   Potassium 3.6 - 5.1 mmol/L 3.8   Chloride 97 - 109 mmol/L 105   Carbon Dioxide (CO2) 22.0 - 32.0 mmol/L 27.7   Urea Nitrogen (BUN) 7 - 25 mg/dL 13   Creatinine 0.7 - 1.3 mg/dL 0.8   Glomerular Filtration Rate (eGFR), MDRD Estimate >60 mL/min/1.73sq m 93   Calcium 8.7 - 10.3 mg/dL 9.0   AST  8 - 39 U/L 20   ALT  6 - 57 U/L 22   Alk Phos (alkaline Phosphatase) 34 - 104 U/L 82   Albumin 3.5 - 4.8 g/dL 4.3   Bilirubin, Total 0.3 - 1.2 mg/dL 1.3 High    Protein, Total 6.1 - 7.9 g/dL 6.3   A/G Ratio 1.0 - 5.0 gm/dL 2.2   Resulting Agency   [facility] - [facility]  Specimen Collected: 04/01/21 11:14 Last Resulted: 04/01/21 17:12  Received From: [facility]  Result Received: 04/02/21 07:42    ECG: Date: 04/05/2021 Time ECG obtained: 0820 AM Rate: 66 bpm Rhythm:  Atrial fibrillation with occasional ventricular paced complexes with ventricular escape complexes Axis (leads I and aVF): Right axis deviation Intervals: QRS 108 ms. QTc 448 ms. ST segment and T wave changes: Inferolateral TWIs; unchanged.  Comparison: Similar to previous tracing obtained on 08/31/2020   IMAGING / PROCEDURES: CT ABDOMEN PELVIS WITH/WITHOUT CONTRAST performed on 08/01/2020 Similar appearance bilateral nonobstructing renal stones. No ureteral or bladder stones. No secondary changes in either kidney or ureter. No other findings to explain the patient's history of hematuria. Bilateral renal cysts with scattered areas of cortical scarring in both kidneys. Possible but not definite tiny layering stones in the gallbladder. Aortic atherosclerosis  Emphysema   TRANSTHORACIC ECHOCARDIOGRAM performed on 06/27/2000 LVEF 25-30% Severe left ventricular systolic dysfunction Normal right ventricular systolic function Mild TR and PR Moderate MR No AR no valvular stenosis No pericardial effusion  LEXISCAN performed on 04/28/2018 LVEF 49% Normal myocardial thickening and wall motion No artifacts noted Left ventricular cavity size normal No evidence of stress-induced myocardial ischemia or arrhythmia The overall quality of the study is fair  Impression and Plan:  Kevin Marks has been referred for pre-anesthesia review and clearance prior to him undergoing the planned anesthetic and procedural courses. Available labs, pertinent testing, and imaging results were personally reviewed by me. This patient has been appropriately cleared by cardiology with an overall LOW risk of significant perioperative cardiovascular complications.  Completed perioperative prescription for cardiac device management documentation completed by primary cardiology team and placed on patient's chart for review by the surgical/anesthetic team on the day of his procedure.   Based on clinical review performed today (04/05/21), barring any significant acute changes in the patient's overall condition, it is anticipated that he will be able to proceed with the planned surgical intervention. Any acute changes in clinical condition may necessitate his procedure being postponed and/or cancelled. Patient will meet with anesthesia  team (MD and/or CRNA) on the day of his procedure for preoperative evaluation/assessment. Questions regarding anesthetic course will be fielded at that time.   Pre-surgical instructions were reviewed with the patient during his PAT appointment and questions were fielded by PAT clinical staff. Patient was advised that if any questions or concerns arise prior to his procedure then he should return a call to PAT and/or his surgeon's office to discuss.  Honor Loh, MSN, APRN, FNP-C, CEN [facility]  Peri-operative Services Nurse Practitioner Phone: [phone] Fax: [phone] 04/05/21 9:18 AM  NOTE: This note has been prepared using Dragon dictation software. Despite my best ability to proofread, there is always the potential that unintentional transcriptional errors may still occur from this process.

## 2021-04-08 ENCOUNTER — Encounter: Payer: Self-pay | Admitting: General Surgery

## 2021-04-08 ENCOUNTER — Other Ambulatory Visit: Payer: Self-pay

## 2021-04-08 ENCOUNTER — Encounter
Admission: RE | Disposition: A | Discharge status: Home / Self Care | Payer: Self-pay | Source: Home / Self Care | Attending: General Surgery

## 2021-04-08 ENCOUNTER — Ambulatory Visit
Admission: RE | Admit: 2021-04-08 | Discharge: 2021-04-08 | Disposition: A | Discharge status: Home / Self Care | Payer: Medicare HMO | Attending: General Surgery | Admitting: General Surgery

## 2021-04-08 ENCOUNTER — Ambulatory Visit: Payer: Medicare HMO | Admitting: Urgent Care

## 2021-04-08 DIAGNOSIS — Z7901 Long term (current) use of anticoagulants: Secondary | ICD-10-CM | POA: Insufficient documentation

## 2021-04-08 DIAGNOSIS — K420 Umbilical hernia with obstruction, without gangrene: Secondary | ICD-10-CM | POA: Insufficient documentation

## 2021-04-08 DIAGNOSIS — I4891 Unspecified atrial fibrillation: Secondary | ICD-10-CM | POA: Insufficient documentation

## 2021-04-08 DIAGNOSIS — Z87891 Personal history of nicotine dependence: Secondary | ICD-10-CM | POA: Insufficient documentation

## 2021-04-08 DIAGNOSIS — C679 Malignant neoplasm of bladder, unspecified: Secondary | ICD-10-CM | POA: Insufficient documentation

## 2021-04-08 DIAGNOSIS — I110 Hypertensive heart disease with heart failure: Secondary | ICD-10-CM | POA: Insufficient documentation

## 2021-04-08 DIAGNOSIS — Z8052 Family history of malignant neoplasm of bladder: Secondary | ICD-10-CM | POA: Insufficient documentation

## 2021-04-08 DIAGNOSIS — Z95810 Presence of automatic (implantable) cardiac defibrillator: Secondary | ICD-10-CM | POA: Diagnosis not present

## 2021-04-08 DIAGNOSIS — I5022 Chronic systolic (congestive) heart failure: Secondary | ICD-10-CM | POA: Diagnosis not present

## 2021-04-08 DIAGNOSIS — K429 Umbilical hernia without obstruction or gangrene: Secondary | ICD-10-CM | POA: Diagnosis present

## 2021-04-08 DIAGNOSIS — Z7984 Long term (current) use of oral hypoglycemic drugs: Secondary | ICD-10-CM | POA: Diagnosis not present

## 2021-04-08 HISTORY — DX: Unspecified systolic (congestive) heart failure: I50.20

## 2021-04-08 HISTORY — DX: Prediabetes: R73.03

## 2021-04-08 HISTORY — DX: Secondary pulmonary arterial hypertension: I27.21

## 2021-04-08 HISTORY — DX: Atherosclerosis of aorta: I70.0

## 2021-04-08 HISTORY — DX: Cyst of kidney, acquired: N28.1

## 2021-04-08 HISTORY — DX: Long term (current) use of anticoagulants: Z79.01

## 2021-04-08 HISTORY — DX: Atherosclerotic heart disease of native coronary artery without angina pectoris: I25.10

## 2021-04-08 HISTORY — DX: Endocarditis, valve unspecified: I38

## 2021-04-08 LAB — GLUCOSE, CAPILLARY: Glucose-Capillary: 106 mg/dL — ABNORMAL HIGH (ref 70–99)

## 2021-04-08 LAB — POTASSIUM: Potassium: 3.9 mmol/L (ref 3.5–5.1)

## 2021-04-08 SURGERY — REPAIR, HERNIA, UMBILICAL, ROBOT-ASSISTED
Anesthesia: General | Site: Abdomen

## 2021-04-08 MED ORDER — SUGAMMADEX SODIUM 200 MG/2ML IV SOLN
INTRAVENOUS | Status: DC | PRN
  Administered 2021-04-08: 200 mg via INTRAVENOUS

## 2021-04-08 MED ORDER — PHENYLEPHRINE HCL (PRESSORS) 10 MG/ML IV SOLN
INTRAVENOUS | Status: DC | PRN
  Administered 2021-04-08: 100 ug via INTRAVENOUS

## 2021-04-08 MED ORDER — 0.9 % SODIUM CHLORIDE (POUR BTL) OPTIME
TOPICAL | Status: DC | PRN
  Administered 2021-04-08: 100 mL

## 2021-04-08 MED ORDER — FENTANYL CITRATE (PF) 100 MCG/2ML IJ SOLN
INTRAMUSCULAR | Status: DC | PRN
  Administered 2021-04-08 (×3): 25 ug via INTRAVENOUS

## 2021-04-08 MED ORDER — HYDRALAZINE HCL 20 MG/ML IJ SOLN
INTRAMUSCULAR | Status: AC
  Administered 2021-04-08: 10 mg via INTRAVENOUS
  Filled 2021-04-08: qty 1

## 2021-04-08 MED ORDER — HYDRALAZINE HCL 20 MG/ML IJ SOLN: 10.0000 mg | Freq: Once | INTRAMUSCULAR | Status: AC

## 2021-04-08 MED ORDER — SODIUM CHLORIDE 0.9 % IV SOLN: INTRAVENOUS | Status: DC

## 2021-04-08 MED ORDER — ACETAMINOPHEN 10 MG/ML IV SOLN
INTRAVENOUS | Status: AC
  Filled 2021-04-08: qty 100

## 2021-04-08 MED ORDER — BUPIVACAINE-EPINEPHRINE (PF) 0.25% -1:200000 IJ SOLN
INTRAMUSCULAR | Status: AC
  Filled 2021-04-08: qty 30

## 2021-04-08 MED ORDER — CEFAZOLIN SODIUM-DEXTROSE 2-4 GM/100ML-% IV SOLN
2.0000 g | INTRAVENOUS | Status: AC
Start: 2021-04-08 — End: 2021-04-08
  Administered 2021-04-08: 2 g via INTRAVENOUS

## 2021-04-08 MED ORDER — OXYCODONE HCL 5 MG PO TABS: 5.0000 mg | ORAL_TABLET | Freq: Once | ORAL | Status: AC | PRN

## 2021-04-08 MED ORDER — ONDANSETRON HCL 4 MG/2ML IJ SOLN: 4.0000 mg | Freq: Once | INTRAMUSCULAR | Status: DC | PRN

## 2021-04-08 MED ORDER — OXYCODONE HCL 5 MG PO TABS
ORAL_TABLET | ORAL | Status: AC
  Filled 2021-04-08: qty 1

## 2021-04-08 MED ORDER — ORAL CARE MOUTH RINSE: 15.0000 mL | Freq: Once | OROMUCOSAL | Status: AC

## 2021-04-08 MED ORDER — ONDANSETRON HCL 4 MG/2ML IJ SOLN
INTRAMUSCULAR | Status: DC | PRN
  Administered 2021-04-08: 4 mg via INTRAVENOUS

## 2021-04-08 MED ORDER — LIDOCAINE HCL (CARDIAC) PF 100 MG/5ML IV SOSY
PREFILLED_SYRINGE | INTRAVENOUS | Status: DC | PRN
  Administered 2021-04-08: 10 mg via INTRAVENOUS

## 2021-04-08 MED ORDER — LACTATED RINGERS IV SOLN: INTRAVENOUS | Status: DC

## 2021-04-08 MED ORDER — FENTANYL CITRATE (PF) 100 MCG/2ML IJ SOLN
INTRAMUSCULAR | Status: AC
  Administered 2021-04-08: 25 ug via INTRAVENOUS
  Filled 2021-04-08: qty 2

## 2021-04-08 MED ORDER — OXYCODONE HCL 5 MG/5ML PO SOLN
5.0000 mg | Freq: Once | ORAL | Status: AC | PRN
Start: 2021-04-08 — End: 2021-04-08
  Administered 2021-04-08: 5 mg via ORAL

## 2021-04-08 MED ORDER — ACETAMINOPHEN 10 MG/ML IV SOLN
1000.0000 mg | Freq: Once | INTRAVENOUS | Status: DC | PRN
  Administered 2021-04-08: 1000 mg via INTRAVENOUS

## 2021-04-08 MED ORDER — CHLORHEXIDINE GLUCONATE 0.12 % MT SOLN: 15.0000 mL | Freq: Once | OROMUCOSAL | Status: AC

## 2021-04-08 MED ORDER — CHLORHEXIDINE GLUCONATE 0.12 % MT SOLN
OROMUCOSAL | Status: AC
  Administered 2021-04-08: 15 mL via OROMUCOSAL
  Filled 2021-04-08: qty 15

## 2021-04-08 MED ORDER — PROPOFOL 10 MG/ML IV BOLUS
INTRAVENOUS | Status: DC | PRN
  Administered 2021-04-08: 110 mg via INTRAVENOUS

## 2021-04-08 MED ORDER — ROCURONIUM BROMIDE 10 MG/ML (PF) SYRINGE
PREFILLED_SYRINGE | INTRAVENOUS | Status: AC
  Filled 2021-04-08: qty 10

## 2021-04-08 MED ORDER — ROCURONIUM BROMIDE 100 MG/10ML IV SOLN
INTRAVENOUS | Status: DC | PRN
  Administered 2021-04-08: 50 mg via INTRAVENOUS

## 2021-04-08 MED ORDER — ONDANSETRON HCL 4 MG/2ML IJ SOLN
INTRAMUSCULAR | Status: AC
  Filled 2021-04-08: qty 2

## 2021-04-08 MED ORDER — FENTANYL CITRATE (PF) 100 MCG/2ML IJ SOLN
25.0000 ug | INTRAMUSCULAR | Status: DC | PRN
  Administered 2021-04-08 (×3): 25 ug via INTRAVENOUS

## 2021-04-08 MED ORDER — BUPIVACAINE-EPINEPHRINE (PF) 0.25% -1:200000 IJ SOLN
INTRAMUSCULAR | Status: DC | PRN
  Administered 2021-04-08: 10 mL via PERINEURAL
  Administered 2021-04-08: 20 mL via PERINEURAL

## 2021-04-08 MED ORDER — CEFAZOLIN SODIUM-DEXTROSE 2-4 GM/100ML-% IV SOLN
INTRAVENOUS | Status: AC
  Filled 2021-04-08: qty 100

## 2021-04-08 MED ORDER — HYDROCODONE-ACETAMINOPHEN 5-325 MG PO TABS: 1.0000 | ORAL_TABLET | ORAL | 0 refills | Status: AC | PRN

## 2021-04-08 MED ORDER — FENTANYL CITRATE (PF) 100 MCG/2ML IJ SOLN
INTRAMUSCULAR | Status: AC
  Filled 2021-04-08: qty 2

## 2021-04-08 SURGICAL SUPPLY — 45 items
ADH SKN CLS APL DERMABOND .7 (GAUZE/BANDAGES/DRESSINGS) ×1
APL PRP STRL LF DISP 70% ISPRP (MISCELLANEOUS) ×1
BLADE SURG SZ11 CARB STEEL (BLADE) ×2 IMPLANT
CHLORAPREP W/TINT 26 (MISCELLANEOUS) ×2 IMPLANT
COVER TIP SHEARS 8 DVNC (MISCELLANEOUS) ×1 IMPLANT
COVER TIP SHEARS 8MM DA VINCI (MISCELLANEOUS) ×1
DEFOGGER SCOPE WARMER CLEARIFY (MISCELLANEOUS) ×2 IMPLANT
DERMABOND ADVANCED (GAUZE/BANDAGES/DRESSINGS) ×1
DERMABOND ADVANCED .7 DNX12 (GAUZE/BANDAGES/DRESSINGS) ×1 IMPLANT
DRAPE ARM DVNC X/XI (DISPOSABLE) ×3 IMPLANT
DRAPE COLUMN DVNC XI (DISPOSABLE) ×1 IMPLANT
DRAPE DA VINCI XI ARM (DISPOSABLE) ×3
DRAPE DA VINCI XI COLUMN (DISPOSABLE) ×1
ELECT REM PT RETURN 9FT ADLT (ELECTROSURGICAL) ×2
ELECTRODE REM PT RTRN 9FT ADLT (ELECTROSURGICAL) ×1 IMPLANT
GAUZE 4X4 16PLY ~~LOC~~+RFID DBL (SPONGE) ×2 IMPLANT
GLOVE SURG ENC MOIS LTX SZ6.5 (GLOVE) ×4 IMPLANT
GLOVE SURG UNDER POLY LF SZ6.5 (GLOVE) ×4 IMPLANT
GOWN STRL REUS W/ TWL LRG LVL3 (GOWN DISPOSABLE) ×3 IMPLANT
GOWN STRL REUS W/TWL LRG LVL3 (GOWN DISPOSABLE) ×6
KIT PINK PAD W/HEAD ARE REST (MISCELLANEOUS) ×2
KIT PINK PAD W/HEAD ARM REST (MISCELLANEOUS) ×1 IMPLANT
LABEL OR SOLS (LABEL) ×2 IMPLANT
MANIFOLD NEPTUNE II (INSTRUMENTS) ×2 IMPLANT
MESH VENTRALIGHT ST 4.5IN (Mesh General) ×2 IMPLANT
NEEDLE HYPO 22GX1.5 SAFETY (NEEDLE) ×2 IMPLANT
NEEDLE INSUFFLATION 14GA 120MM (NEEDLE) ×2 IMPLANT
NS IRRIG 500ML POUR BTL (IV SOLUTION) ×2 IMPLANT
OBTURATOR OPTICAL STANDARD 8MM (TROCAR) ×1
OBTURATOR OPTICAL STND 8 DVNC (TROCAR) ×1
OBTURATOR OPTICALSTD 8 DVNC (TROCAR) ×1 IMPLANT
PACK LAP CHOLECYSTECTOMY (MISCELLANEOUS) ×2 IMPLANT
SEAL CANN UNIV 5-8 DVNC XI (MISCELLANEOUS) ×3 IMPLANT
SEAL XI 5MM-8MM UNIVERSAL (MISCELLANEOUS) ×3
SET TUBE SMOKE EVAC HIGH FLOW (TUBING) ×2 IMPLANT
SOLUTION ELECTROLUBE (MISCELLANEOUS) ×2 IMPLANT
STERILE DRAPE FOR ROOM SCANNER ×2 IMPLANT
SUT MNCRL 4-0 (SUTURE) ×2
SUT MNCRL 4-0 27XMFL (SUTURE) ×1
SUT STRATAFIX PDS 30 CT-1 (SUTURE) ×2 IMPLANT
SUT VICRYL 0 AB UR-6 (SUTURE) ×2 IMPLANT
SUT VLOC 90 S/L VL9 GS22 (SUTURE) ×2 IMPLANT
SUTURE MNCRL 4-0 27XMF (SUTURE) ×1 IMPLANT
TAPE TRANSPORE STRL 2 31045 (GAUZE/BANDAGES/DRESSINGS) ×2 IMPLANT
WATER STERILE IRR 500ML POUR (IV SOLUTION) ×2 IMPLANT

## 2021-04-08 NOTE — Anesthesia Procedure Notes (Signed)
Arterial Line Insertion Start/End10/03/2021 11:21 AM, 04/08/2021 11:23 AM Performed by: Darrin Nipper, MD, Johney Maine, CRNA, CRNA  Preanesthetic checklist: patient identified, IV checked, risks and benefits discussed, surgical consent, monitors and equipment checked, pre-op evaluation and timeout performed Patient sedated Right, radial was placed Hand hygiene performed  and Seldinger technique used  Procedure performed without using ultrasound guided technique. Following insertion, dressing applied. Post procedure assessment: normal  Patient tolerated the procedure well with no immediate complications.

## 2021-04-08 NOTE — Anesthesia Procedure Notes (Addendum)
Procedure Name: Intubation Date/Time: 04/08/2021 11:03 AM Performed by: Johney Maine, CRNA Pre-anesthesia Checklist: Patient identified, Patient being monitored, Timeout performed, Emergency Drugs available and Suction available Patient Re-evaluated:Patient Re-evaluated prior to induction Oxygen Delivery Method: Circle system utilized Preoxygenation: Pre-oxygenation with 100% oxygen Induction Type: IV induction Ventilation: Mask ventilation without difficulty Laryngoscope Size: Mac, 3 and McGraph Grade View: Grade II Tube type: Oral Tube size: 7.5 mm Number of attempts: 2 (Grade IIb view, only posterior arytenoids visualized, none of posterior glottis visualized) Airway Equipment and Method: Stylet and Bougie stylet Placement Confirmation: ETT inserted through vocal cords under direct vision, positive ETCO2 and breath sounds checked- equal and bilateral Secured at: 22 cm Tube secured with: Tape Dental Injury: Teeth and Oropharynx as per pre-operative assessment  Difficulty Due To: Difficulty was unanticipated, Difficult Airway- due to immobile epiglottis, Difficult Airway- due to anterior larynx, Difficult Airway- due to dentition and Difficult Airway- due to limited oral opening

## 2021-04-08 NOTE — Anesthesia Preprocedure Evaluation (Addendum)
Anesthesia Evaluation  Patient identified by MRN, date of birth, ID band Patient awake    Reviewed: Allergy & Precautions, NPO status , Patient's Chart, lab work & pertinent test results  History of Anesthesia Complications Negative for: history of anesthetic complications  Airway Mallampati: III   Neck ROM: Full    Dental   Missing few molars:   Pulmonary former smoker (quit 40 years ago),    Pulmonary exam normal breath sounds clear to auscultation       Cardiovascular hypertension, + CAD (s/p MI) and +CHF  Normal cardiovascular exam+ dysrhythmias (a fib on Eliquis) + Cardiac Defibrillator  Rhythm:Regular Rate:Normal  ECG 04/05/21: a fib with occasional PVCs, RAD, incomplete RBBB, ST and T wave abnormality  Echo 06/27/20:  SEVERE LV SYSTOLIC DYSFUNCTION NORMAL RIGHT VENTRICULAR SYSTOLIC FUNCTION  MODERATE VALVULAR REGURGITATION   NO VALVULAR STENOSIS  MILD MR, TR, PR  EF 25-30%    Neuro/Psych PSYCHIATRIC DISORDERS Depression negative neurological ROS     GI/Hepatic GERD  ,  Endo/Other  Prediabetes   Renal/GU negative Renal ROS   BPH    Musculoskeletal  (+) Arthritis ,   Abdominal   Peds  Hematology negative hematology ROS (+)   Anesthesia Other Findings Reviewed 01/22/21 cardiology note.  Reproductive/Obstetrics                            Anesthesia Physical Anesthesia Plan  ASA: 4  Anesthesia Plan: General   Post-op Pain Management:    Induction: Intravenous  PONV Risk Score and Plan: 2 and Ondansetron, Dexamethasone and Treatment may vary due to age or medical condition  Airway Management Planned: Oral ETT  Additional Equipment: Arterial line  Intra-op Plan:   Post-operative Plan: Extubation in OR  Informed Consent: I have reviewed the patients History and Physical, chart, labs and discussed the procedure including the risks, benefits and alternatives for the  proposed anesthesia with the patient or authorized representative who has indicated his/her understanding and acceptance.       Plan Discussed with: CRNA  Anesthesia Plan Comments: (Spoke with Dr. Alveria Apley office on DOS -- magnet acceptable, no need to interrogate postop.)      Anesthesia Quick Evaluation

## 2021-04-08 NOTE — Transfer of Care (Signed)
Immediate Anesthesia Transfer of Care Note  Patient: [name]  Procedure(s) Performed: XI ROBOT ASSISTED UMBILICAL HERNIA REPAIR (Abdomen)  Patient Location: PACU  Anesthesia Type:General  Level of Consciousness: awake, alert  and oriented  Airway & Oxygen Therapy: Patient Spontanous Breathing and Patient connected to face mask oxygen  Post-op Assessment: Report given to RN and Post -op Vital signs reviewed and stable  Post vital signs: Reviewed and stable  Last Vitals:  Vitals Value Taken Time  BP 132/87 04/08/21 1245  Temp    Pulse 79 04/08/21 1245  Resp 21 04/08/21 1247  SpO2 100 % 04/08/21 1245  Vitals shown include unvalidated device data.  Last Pain:  Vitals:   04/08/21 0936  TempSrc: Temporal  PainSc: 0-No pain         Complications: No notable events documented.

## 2021-04-08 NOTE — Op Note (Signed)
Preoperative diagnosis: Umbilical Hernia  Postoperative diagnosis: Umbilical Hernia  Procedure: Robotic assisted laparoscopic umbilical hernia repair with mesh  Anesthesia: General  Surgeon: Dr. Windell Moment  Wound Classification: Clean  Specimen: None  Complications: None  Estimated Blood Loss: 13ml  Indications: Patient is a 80 y.o. male developed an umbilical hernia. This was symptomatic and repair was indicated.   Findings: 2 cm incarcerated umbilical hernia 2.  Repair achieved with closure of the anterior fascia at midline and 11.4 cm round Bard mesh 3. Adequate hemostasis  Description of procedure: The patient was brought to the operating room and general anesthesia was induced. A time-out was completed verifying correct patient, procedure, site, positioning, and implant(s) and/or special equipment prior to beginning this procedure. Antibiotics were administered prior to making the incision. SCDs placed. The anterior abdominal wall was prepped and draped in the standard sterile fashion.   Palmer's point chosen for entry.  Veress needle placed and abdomen insufflated to 15cm without any dramatic increase in pressure.  Needle removed and optiview technique used to place 24mm port at same point.  No injury noted during placement.  Two additional ports, 67mm x2 along left lateral aspect placed.  Xi robot then docked into place.  Hernia contents noted and reduced with combination of blunt, sharp dissection with scissors and fenestrated forceps.  Hemostasis achieved throughout this portion.  Once all hernia contents reduced, there was noted to be a 2 cm hernia.    Insufflation dropped to 8 mmHg and transfacial suture with 0 stratafix used to primarily close defect under minimal tension. Bard echo plus protected 11.4 round mesh was placed within the abdominal cavity through 8 mm port and secured to the abdominal wall centered over the defect using the 0 stratafix previously used to  primarily close defect.  The mesh was then circumferentially sutured into the anterior abdominal wall using 2-0 VLock x2.  Any bleeding noted during this portion was no longer actively bleeding by end of securing mesh and tightening the suture.    Robot was undocked.  Abdomen then desufflated while camera within abdomen to ensure no signs of new bleed prior to removing camera and rest of ports completely.  All skin incisions closed with runninrg 4-0 Monocryl in a subcuticular fashion.  All wounds then dressed with Dermabond.  Patient was then successfully awakened and transferred to PACU in stable condition.  At the end of the procedure sponge and instrument counts were correct.

## 2021-04-08 NOTE — Anesthesia Procedure Notes (Addendum)
Arterial Line Insertion Start/End10/03/2021 11:15 AM, 04/08/2021 11:20 AM Performed by: Darrin Nipper, MD, Johney Maine, CRNA, CRNA  Patient location: OR. Preanesthetic checklist: patient identified, IV checked, surgical consent, monitors and equipment checked, pre-op evaluation and timeout performed Right, radial was placed Catheter size: 20 G Hand hygiene performed , maximum sterile barriers used  and Seldinger technique used Allen's test indicative of satisfactory collateral circulation Attempts: 1 Procedure performed without using ultrasound guided technique. Patient tolerated the procedure well with no immediate complications.

## 2021-04-08 NOTE — Interval H&P Note (Signed)
History and Physical Interval Note:  04/08/2021 10:16 AM  Kevin Marks  has presented today for surgery, with the diagnosis of X65.8 Umbilical hernia w/o obstruction or gangrene.  The various methods of treatment have been discussed with the patient and family. After consideration of risks, benefits and other options for treatment, the patient has consented to  Procedure(s): XI [place] (N/A) as a surgical intervention.  The patient's history has been reviewed, patient examined, no change in status, stable for surgery.  I have reviewed the patient's chart and labs.  Questions were answered to the patient's satisfaction.     Herbert Pun

## 2021-04-08 NOTE — Progress Notes (Signed)
Patient awake/alert x4. Abd discomfort r/t surgery 5/10 Patient in afib with perm pacemaker/AICD: [place] anesthesia aware of bp elevated. Per Dr. Nira Retort ordered 10mg  hydralazine IV. Medication effective 158/89. OK to transfer to post op.

## 2021-04-08 NOTE — Discharge Instructions (Addendum)

## 2021-04-09 NOTE — Anesthesia Postprocedure Evaluation (Signed)
Anesthesia Post Note  Patient: Kevin Marks  Procedure(s) Performed: XI ROBOT ASSISTED UMBILICAL HERNIA REPAIR (Abdomen)  Patient location during evaluation: PACU Anesthesia Type: General Level of consciousness: awake and alert, oriented and patient cooperative Pain management: pain level controlled Vital Signs Assessment: post-procedure vital signs reviewed and stable Respiratory status: spontaneous breathing, nonlabored ventilation and respiratory function stable Cardiovascular status: blood pressure returned to baseline and stable Postop Assessment: adequate PO intake Anesthetic complications: no   No notable events documented.   Last Vitals:  Vitals:   04/08/21 1415 04/08/21 1442  BP:  (!) 157/96  Pulse: 82 87  Resp:  20  Temp:  36.7 C  SpO2:  96%    Last Pain:  Vitals:   04/09/21 0832  TempSrc:   PainSc: 2                  Darrin Nipper

## 2021-04-16 LAB — POCT I-STAT, CHEM 8
BUN: 20 mg/dL (ref 8–23)
Calcium, Ion: 1.07 mmol/L — ABNORMAL LOW (ref 1.15–1.40)
Chloride: 109 mmol/L (ref 98–111)
Creatinine, Ser: 0.8 mg/dL (ref 0.61–1.24)
Glucose, Bld: 109 mg/dL — ABNORMAL HIGH (ref 70–99)
HCT: 48 % (ref 39.0–52.0)
Hemoglobin: 16.3 g/dL (ref 13.0–17.0)
Potassium: 7.5 mmol/L (ref 3.5–5.1)
Sodium: 139 mmol/L (ref 135–145)
TCO2: 26 mmol/L (ref 22–32)

## 2022-03-21 IMAGING — CT CT ABD-PEL WO/W CM
2 of 12 series · 9 of 46 positions shown, 15 images · IV contrast (omnipaque)
Comparison: 11/18/2018

CLINICAL DATA: Microscopic hematuria.

EXAM:
CT ABDOMEN AND PELVIS WITHOUT AND WITH CONTRAST
TECHNIQUE: Multidetector CT imaging of the abdomen and pelvis was performed
following the standard protocol before and following the bolus
administration of intravenous contrast.
CONTRAST:  100mL OMNIPAQUE IOHEXOL 300 MG/ML  SOLN

[Series 17: axial delay delay prone 5.00 · axial · delayed · 0.74mm/px · z∈[-1438,-1048]mm · 7 of 104 slices shown, 12 images]
[im 13/104  soft-tissue]
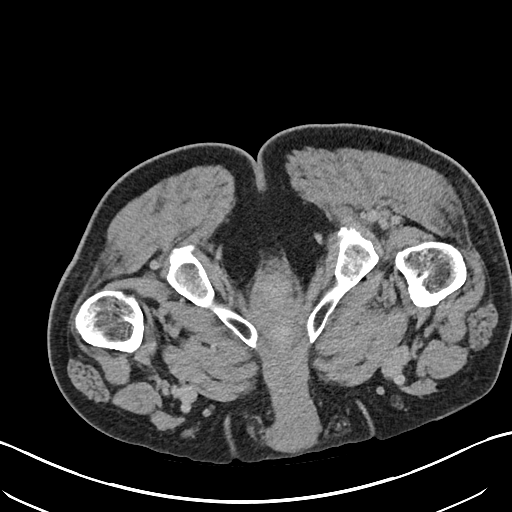
[im 13/104  bone]
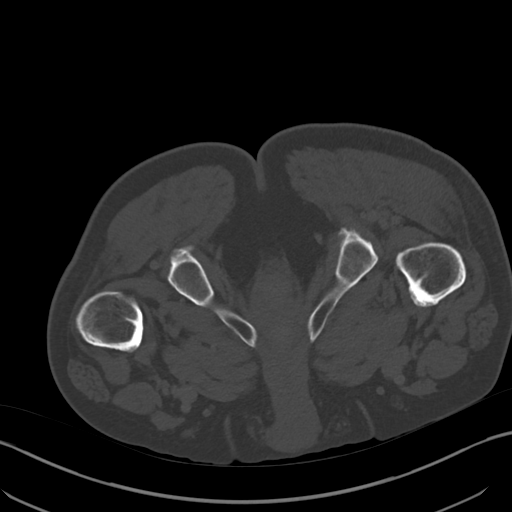
[im 26/104  soft-tissue]
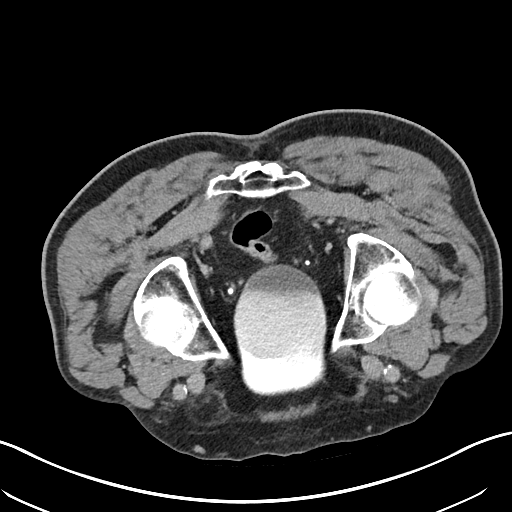
[im 39/104  soft-tissue]
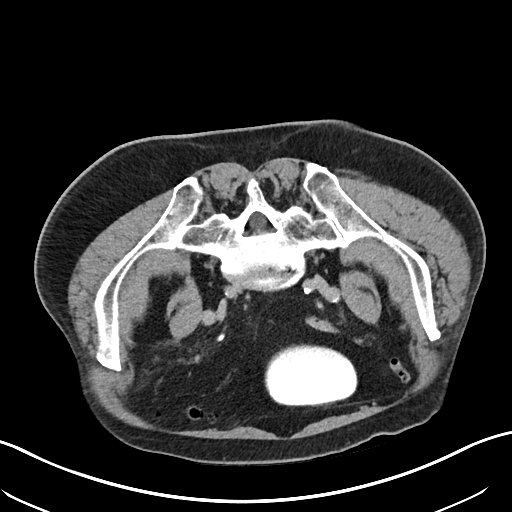
[im 52/104  soft-tissue]
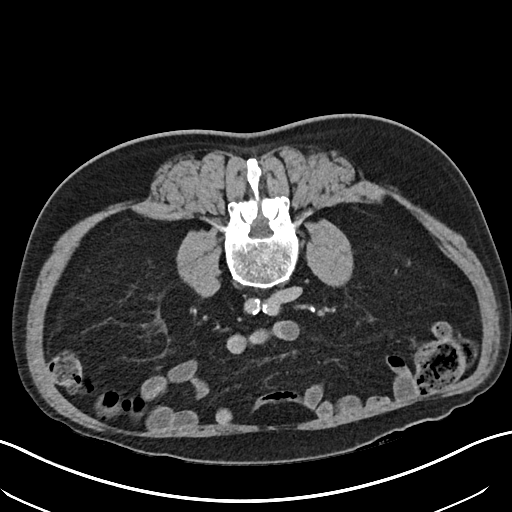
[im 52/104  lung]
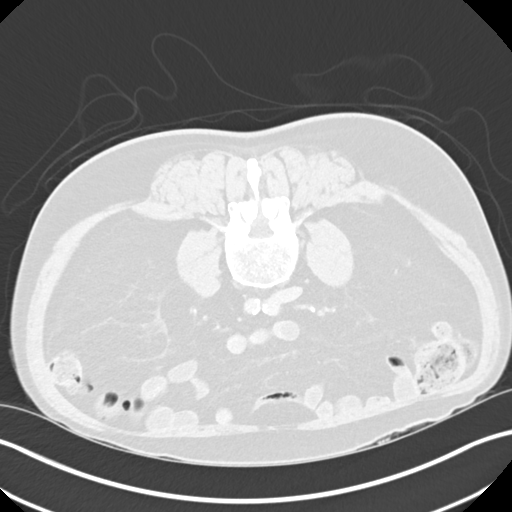
[im 65/104  soft-tissue]
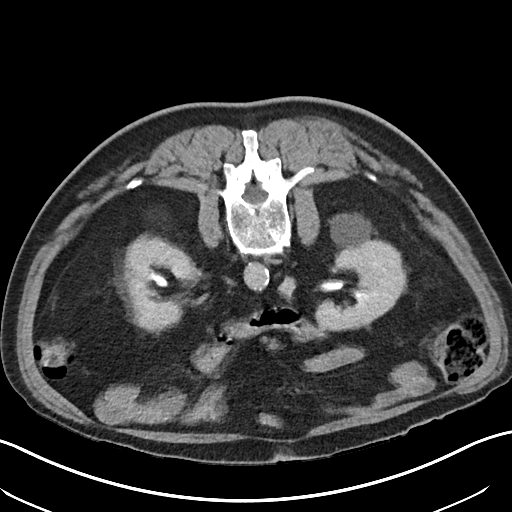
[im 65/104  lung]
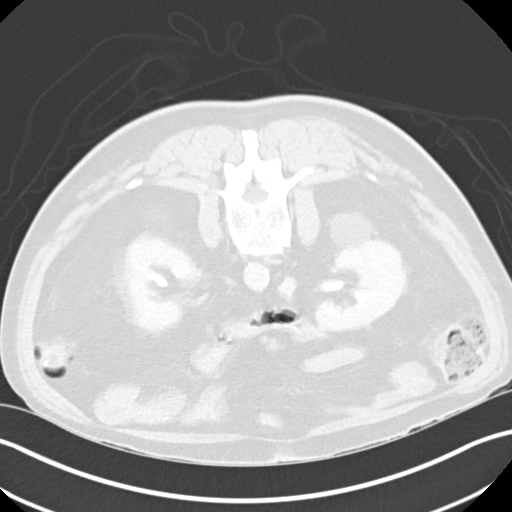
[im 78/104  soft-tissue]
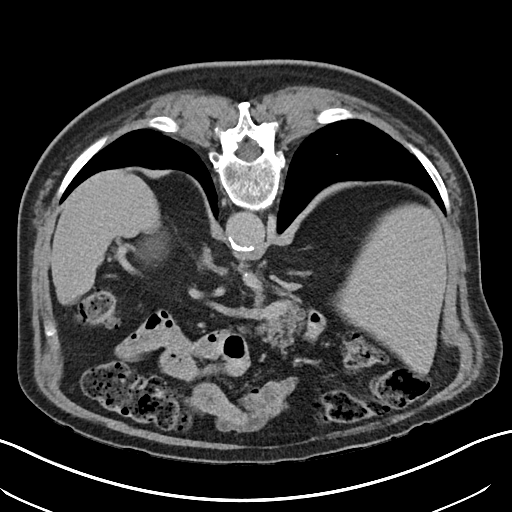
[im 78/104  lung]
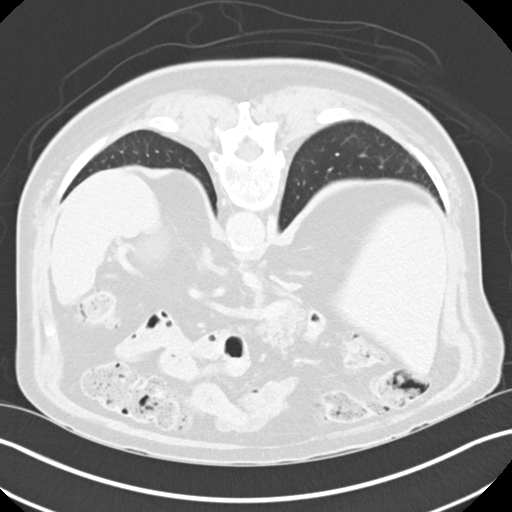
[im 91/104  soft-tissue]
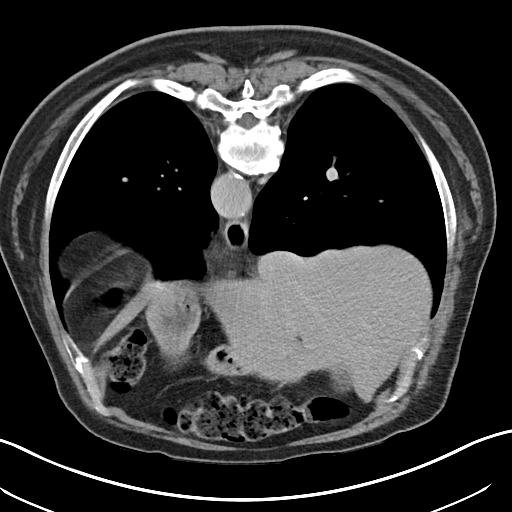
[im 91/104  lung]
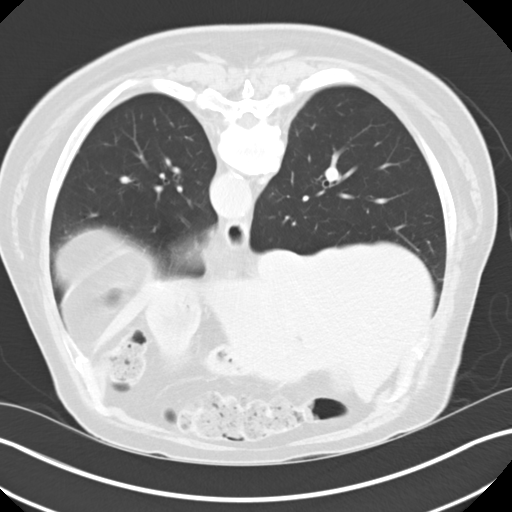

[Series 20: cor delay delay prone 2.00 cor · coronal · delayed · 0.74mm/px · 2 of 174 slices shown, 3 images]
[im 58/174  soft-tissue]
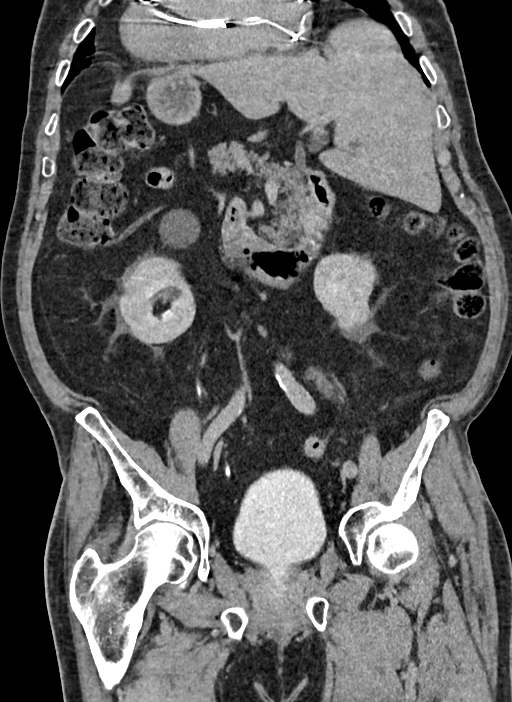
[im 58/174  bone]
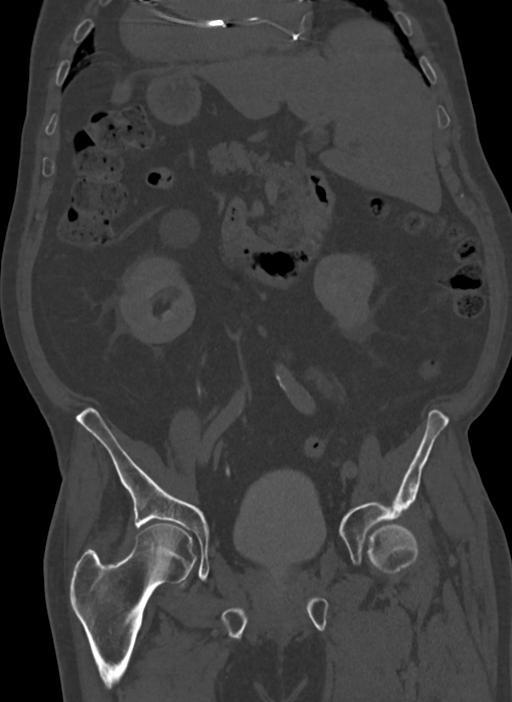
[im 116/174  soft-tissue]
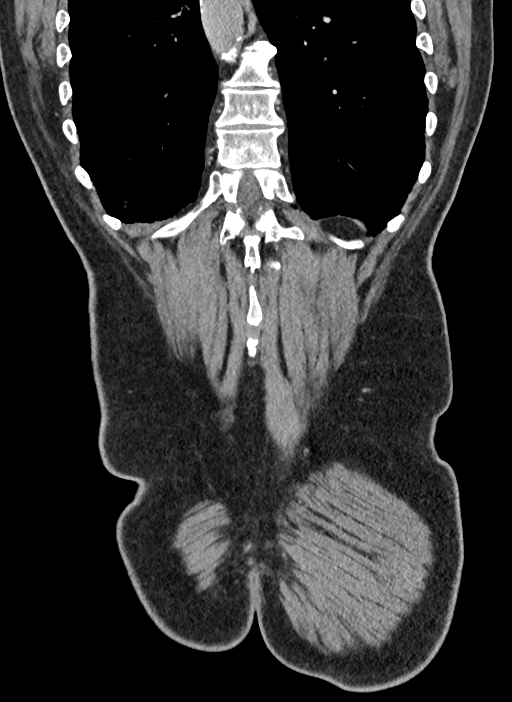

[9 of 46 positions shown; findings below may reference images not displayed]

FINDINGS: Lower chest: Centrilobular emphsyema noted.

Hepatobiliary: Scattered tiny hypoattenuating lesions in the liver
are stable, likely cysts. Possible but not definite tiny layering
stones in the gallbladder (axial [DATE]). No intrahepatic or
extrahepatic biliary dilation.

Pancreas: No focal mass lesion. No dilatation of the main duct. No
intraparenchymal cyst. No peripancreatic edema.

Spleen: No splenomegaly. No focal mass lesion.

Adrenals/Urinary Tract: No adrenal nodule or mass. 5 mm
nonobstructing stone lower pole right kidney (39/2) is similar to
prior. 1 mm nonobstructing stone in the lower pole right kidney is
visible on 37/2. 7 x 4 mm nonobstructing interpolar left renal stone
is stable. No ureteral or bladder stones.

Imaging after IV contrast administration shows scattered areas of
cortical scarring in both kidneys. 3 cm exophytic posterior
interpolar right renal cyst is stable in the interval. Additional
tiny hypodensities in the right kidney are too small to characterize
but likely benign. Dominant exophytic cyst upper pole left kidney
measures 7.7 cm, mildly increased from 7.4 cm previously. Exophytic
cyst anterior upper pole left kidney measures 3.7 cm today compared
to 3.2 cm previously. Additional scattered tiny hypodensities in the
left kidney are too small to characterize but likely benign.

No suspicious enhancing mass lesion evident in either kidney.

Delayed post-contrast imaging shows no wall thickening or soft
tissue filling defect in either intrarenal collecting system or
renal pelvis. Both ureters are well opacified without evidence for
wall thickening, soft tissue lesion or focal dilatation. Delayed
imaging of the bladder shows no focal wall thickening or mass
lesion.

Stomach/Bowel: Stomach is unremarkable. No gastric wall thickening.
No evidence of outlet obstruction. Duodenum is normally positioned
as is the ligament of Treitz. No small bowel wall thickening. No
small bowel dilatation. The terminal ileum is normal. The appendix
is normal. No gross colonic mass. No colonic wall thickening.
Diverticuli are seen scattered along the entire length of the colon
without CT findings of diverticulitis.

Vascular/Lymphatic: There is abdominal aortic atherosclerosis
without aneurysm. There is no gastrohepatic or hepatoduodenal
ligament lymphadenopathy. No retroperitoneal or mesenteric
lymphadenopathy. No pelvic sidewall lymphadenopathy.

Reproductive: Prostate gland upper normal with probable central TURP
defect.

Other: No intraperitoneal free fluid.

Musculoskeletal: Small umbilical hernia contains only fat. No
worrisome lytic or sclerotic osseous abnormality.
IMPRESSION: 1. Similar appearance bilateral nonobstructing renal stones. No
ureteral or bladder stones. No secondary changes in either kidney or
ureter. No other findings to explain the patient's history of
hematuria.
2. Bilateral renal cysts with scattered areas of cortical scarring
in both kidneys.
3. Possible but not definite tiny layering stones in the
gallbladder.
4. Aortic Atherosclerosis (SMM1X-Z8G.G) and Emphysema (SMM1X-T8Z.A).

## 2022-08-05 ENCOUNTER — Encounter: Payer: Self-pay | Admitting: Urology

## 2022-08-05 ENCOUNTER — Ambulatory Visit: Payer: Medicare HMO | Admitting: Urology

## 2022-08-05 VITALS — BP 145/70 | HR 66 | Ht 69.5 in | Wt 181.8 lb

## 2022-08-05 DIAGNOSIS — R350 Frequency of micturition: Secondary | ICD-10-CM | POA: Diagnosis not present

## 2022-08-05 DIAGNOSIS — N401 Enlarged prostate with lower urinary tract symptoms: Secondary | ICD-10-CM

## 2022-08-05 DIAGNOSIS — Z125 Encounter for screening for malignant neoplasm of prostate: Secondary | ICD-10-CM

## 2022-08-05 DIAGNOSIS — R351 Nocturia: Secondary | ICD-10-CM | POA: Diagnosis not present

## 2022-08-05 DIAGNOSIS — Z8551 Personal history of malignant neoplasm of bladder: Secondary | ICD-10-CM

## 2022-08-05 DIAGNOSIS — C679 Malignant neoplasm of bladder, unspecified: Secondary | ICD-10-CM

## 2022-08-05 MED ORDER — TAMSULOSIN HCL 0.4 MG PO CAPS: 0.4000 mg | ORAL_CAPSULE | Freq: Every day | ORAL | 11 refills | Status: DC

## 2022-08-05 NOTE — Progress Notes (Signed)
08/05/22 9:49 AM   Kevin Marks February 04, 1941 [id number]  CC: History of bladder cancer, BPH, PSA screening, LUTS  HPI: 82 year old male who previously was followed by Dr. Eliberto Ivory for the above issues, and is transferring his care to our office.  He underwent a PVP in 2020 with Dr. Eliberto Ivory for obstructive urinary symptoms, and at that time was found to have a 1 mm and 8 mm papillary bladder tumor.  These were resected and showed low-grade noninvasive urothelial cell carcinoma.  Mitomycin-C was given post procedure.  He remains on Flomax for mild urinary symptoms of weak stream overnight and nocturia x 1.  He does think the Flomax helps his symptoms, he denies any complaints during the day.  He denies any gross hematuria or other problems over the last year.  He is due for cystoscopy.  He has continued to have PSA checked, most recently 0.77 in October 2022.     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 b.) LHC 09/13/2012 -- EF 59%; no significant change from previous; no obstructive CAD.   Cancer (HCC)    Chronic anticoagulation    a.) Apixaban   Colon polyp    Depression    Dyspepsia    ED (erectile dysfunction)    GERD (gastroesophageal reflux disease)    HFrEF (heart failure with reduced ejection fraction) ([place])    a.) TTE 01/22/15 - EF 35%; mod LA enlargement. b.) TTE 04/16/18 - EF 35%; mod LA enlargement. c.) TTE 04/28/19 - EF 25-30%; mod LA enlargement. d.) TTE 06/27/20 - EF 25-30%; mod LA enlargement.   History of colon polyps    History of kidney stones    Hypercholesterolemia    Hypertension    MI, old    PAH (pulmonary  artery hypertension) ([place])    a.) TTE 01/22/15 - mild. b.) TTE 04/16/18 - moderate. c.) TTE 04/28/19 - mild.   Prediabetes    Valvular regurgitation    a.) TTE 01/22/15 - EF 35%; triv AR, mild TR/PR, mod MR. b.) TTE 04/16/18 - EF 35%; mod TR/PR. c.) TTE 04/28/19 - EF 25-30%; mild MR/PR, mod TR. d.) TTE 06/27/20 - EF 25-30%; mild TR/PR, mod MR.    Surgical History: Past Surgical History:  Procedure Laterality Date   CARDIAC CATHETERIZATION Left 08/20/2004   Procedure: CARDIAC CATHETERIZATION; Location: [facility]; Surgeon: Serafina Royals, MD   CARDIAC CATHETERIZATION Left 09/13/2012   Procedure: CARDIAC CATHETERIZATION; Location: [place]; Surgeon: Isaias Cowman, MD   COLONOSCOPY  2013   Dr Vira Agar   COLONOSCOPY WITH PROPOFOL N/A 04/05/2018   Procedure: COLONOSCOPY WITH PROPOFOL;  Surgeon: Manya Silvas, MD;  Location: [facility] ENDOSCOPY;  Service: Endoscopy;  Laterality: N/A;   GREEN LIGHT LASER TURP (TRANSURETHRAL RESECTION OF PROSTATE N/A 12/21/2018   Procedure: GREEN LIGHT LASER TURP (TRANSURETHRAL RESECTION OF PROSTATE;  Surgeon: Royston Cowper, MD;  Location: [facility] ORS;  Service: Urology;  Laterality: N/A;   ICD IMPLANT N/A 02/08/2020   Procedure: INSJ/RPLCMT PERM DFB W/TRNSVNS LDS 1/DUAL CHMBR; Location: [facility]; Surgeon: Mylinda Latina, MD   JOINT REPLACEMENT Left 2018  left knee replacement     MITOMYCIN C APPLICATION N/A 24/02/7352   Procedure: MITOMYCIN C APPLICATION;  Surgeon: Royston Cowper, MD;  Location: [facility] ORS;  Service: Urology;  Laterality: N/A;   PROSTATE SURGERY  2009   TRANSURETHRAL RESECTION OF BLADDER TUMOR N/A 12/21/2018   Procedure: TRANSURETHRAL RESECTION OF BLADDER TUMOR (TURBT);  Surgeon: Royston Cowper, MD;  Location: [facility] ORS;  Service: Urology;  Laterality: N/A;     Family History: Family History  Problem Relation Age of Onset   Alzheimer's disease Mother    Cancer Father     Social History:  reports that he has quit smoking. His smoking use  included cigarettes. He has a 40.00 pack-year smoking history. He has never used smokeless tobacco. He reports that he does not drink alcohol and does not use drugs.  Physical Exam: BP (!) 145/70 (BP Location: Left Arm, Patient Position: Sitting, Cuff Size: Normal)   Pulse 66   Ht 5' 9.5" (1.765 m)   Wt 181 lb 12.8 oz (82.5 kg)   SpO2 95%   BMI 26.46 kg/m    Constitutional:  Alert and oriented, No acute distress. Cardiovascular: No clubbing, cyanosis, or edema. Respiratory: Normal respiratory effort, no increased work of breathing. GI: Abdomen is soft, nontender, nondistended, no abdominal masses   Pertinent Imaging: I have personally viewed and interpreted the CT scan from February 2022 that shows an open prostate channel from prior PVP, no other significant urologic abnormalities, small nonobstructive nephrolithiasis.  Assessment & Plan:   82 year old male previously followed by Dr. Eliberto Ivory for BPH status post greenlight laser PVP in 2020, low-grade noninvasive bladder cancer diagnosed in 2020 with no evidence of recurrence thus far, mild urinary symptoms of nocturia well-controlled on Flomax.  We reviewed he no longer needs PSA screening, and guidelines were reviewed.  Schedule cystoscopy, requests antibiotic prior to procedure.  Plan for cystoscopy yearly x 5 years total(through 2025) Flomax refilled  Nickolas Madrid, MD 08/05/2022  [facility] [address], [place], [place] [address] [phone]

## 2022-09-08 ENCOUNTER — Other Ambulatory Visit: Payer: Self-pay

## 2022-09-08 DIAGNOSIS — Z8551 Personal history of malignant neoplasm of bladder: Secondary | ICD-10-CM

## 2022-09-09 ENCOUNTER — Ambulatory Visit: Payer: Medicare HMO | Admitting: Urology

## 2022-09-09 ENCOUNTER — Other Ambulatory Visit
Admission: RE | Admit: 2022-09-09 | Discharge: 2022-09-09 | Disposition: A | Discharge status: Home / Self Care | Payer: Medicare HMO | Attending: Urology | Admitting: Urology

## 2022-09-09 ENCOUNTER — Encounter: Payer: Self-pay | Admitting: Urology

## 2022-09-09 VITALS — BP 127/76 | HR 73 | Ht 69.5 in | Wt 180.2 lb

## 2022-09-09 DIAGNOSIS — Z8551 Personal history of malignant neoplasm of bladder: Secondary | ICD-10-CM

## 2022-09-09 LAB — URINALYSIS, COMPLETE (UACMP) WITH MICROSCOPIC
Bacteria, UA: NONE SEEN
Bilirubin Urine: NEGATIVE
Glucose, UA: 500 mg/dL — AB
Ketones, ur: NEGATIVE mg/dL
Leukocytes,Ua: NEGATIVE
Nitrite: NEGATIVE
Protein, ur: NEGATIVE mg/dL
Specific Gravity, Urine: 1.01 (ref 1.005–1.030)
pH: 6.5 (ref 5.0–8.0)

## 2022-09-09 MED ORDER — CEPHALEXIN 250 MG PO CAPS
500.0000 mg | ORAL_CAPSULE | Freq: Once | ORAL | Status: AC
  Administered 2022-09-09: 500 mg via ORAL

## 2022-09-09 NOTE — Progress Notes (Signed)
Bladder cancer surveillance note  INDICATION History of low risk bladder cancer  UROLOGIC HISTORY 82 year old male who was originally diagnosed in 2020 by Dr. Eliberto Ivory when he was incidentally found to have a 1 mm and 8 mm papillary bladder tumor at time of PVP.  This showed LG noninvasive TA disease.  Mitomycin was given post procedure.  No evidence of recurrence since that time.  Initial Diagnosis of Bladder  Year: 11/2018 Pathology: LG Ta  Recurrent Bladder Cancer Diagnosis None   AUA Risk Category Low  Cystoscopy Procedure Note:  Keflex given for prophylaxis  After informed consent and discussion of the procedure and its risks, Kevin Marks was positioned and prepped in the standard fashion. Cystoscopy was performed with the a flexible cystoscope. The urethra, bladder neck and entire bladder was visualized in a standard fashion, and prostatic fossa was open consistent with prior PVP. The ureteral orifices were visualized in their normal location and orientation.  Moderate bladder trabeculations, no suspicious lesions.  No abnormalities on retroflexion.  RTC 1 year cystoscopy, if no evidence of recurrence can consider discontinuing surveillance cystoscopy per guideline recommendations  Nickolas Madrid, MD 09/09/2022

## 2023-07-09 ENCOUNTER — Telehealth: Payer: Self-pay | Admitting: Urology

## 2023-07-09 NOTE — Telephone Encounter (Signed)
 His daughter called and Wants to set up an appointment for her Dad to be seen. He has an appointment in March fro a cysto but needs one sooner. He  went to urgent care twice already for UTI with Pscudomonas. They gave him one antibotic but changed it to Siprofloxacin 500 mg 2 times per day for 7 days. Started 2 days ago.

## 2023-07-09 NOTE — Telephone Encounter (Signed)
Spoke with patient and advised results Appt scheduled 

## 2023-07-22 ENCOUNTER — Encounter: Payer: Self-pay | Admitting: Urology

## 2023-07-22 ENCOUNTER — Ambulatory Visit: Payer: Medicare Other | Admitting: Urology

## 2023-07-22 VITALS — BP 128/79 | HR 73 | Ht 68.5 in | Wt 181.0 lb

## 2023-07-22 DIAGNOSIS — Z8551 Personal history of malignant neoplasm of bladder: Secondary | ICD-10-CM

## 2023-07-22 MED ORDER — CEPHALEXIN 250 MG PO CAPS: 500.0000 mg | ORAL_CAPSULE | Freq: Once | ORAL | Status: DC

## 2023-07-22 MED ORDER — CIPROFLOXACIN HCL 500 MG PO TABS
500.0000 mg | ORAL_TABLET | Freq: Once | ORAL | Status: AC
  Administered 2023-07-22: 500 mg via ORAL

## 2023-07-22 NOTE — Progress Notes (Signed)
Bladder cancer surveillance note  INDICATION History of low risk bladder cancer  UROLOGIC HISTORY 83 year old male who was originally diagnosed in 2020 by Dr. Sheppard Penton when he was incidentally found to have a 1 mm and 8 mm papillary bladder tumor at time of PVP.  This showed LG noninvasive TA disease.  Mitomycin was given post procedure.  No evidence of recurrence since that time.  Initial Diagnosis of Bladder  Year: 11/2018 Pathology: LG Ta  Recurrent Bladder Cancer Diagnosis None   AUA Risk Category Low  Cystoscopy Procedure Note:  Cipro given for prophylaxis  After informed consent and discussion of the procedure and its risks, Kevin Marks was positioned and prepped in the standard fashion. Cystoscopy was performed with the a flexible cystoscope. The urethra, bladder neck and entire bladder was visualized in a standard fashion, and prostatic fossa was open consistent with prior PVP. The ureteral orifices were visualized in their normal location and orientation.  Moderate bladder trabeculations.  Small 1 cm patch of erythema at the trigone, directed cytology sent.  -Follow-up cytology, if suspicious or positive will schedule bladder biopsy and fulguration with gemcitabine -If cytology negative, RTC cystoscopy 1 year.  Can likely discontinue screening cystoscopy at that point per guideline recommendations -Has stopped Flomax with no significant change in urinary symptoms, will check PVR at follow-up  Legrand Rams, MD 07/22/2023

## 2023-07-30 ENCOUNTER — Telehealth: Payer: Self-pay

## 2023-07-30 NOTE — Telephone Encounter (Signed)
See my chart message

## 2023-07-30 NOTE — Telephone Encounter (Signed)
-----   Message from Sondra Come sent at 07/29/2023 12:28 PM EST ----- Regarding: Cytology results Good news, no cancer cells on urine specimen.  Follow-up in 1 year for cystoscopy  Legrand Rams, MD 07/29/2023 ----- Message ----- From: Annamaria Helling Sent: 07/27/2023   2:54 PM EST To: Sondra Come, MD

## 2023-08-30 ENCOUNTER — Other Ambulatory Visit: Payer: Self-pay | Admitting: Urology

## 2023-09-07 ENCOUNTER — Other Ambulatory Visit: Payer: Self-pay | Admitting: Family Medicine

## 2023-09-07 DIAGNOSIS — N644 Mastodynia: Secondary | ICD-10-CM

## 2023-09-15 ENCOUNTER — Other Ambulatory Visit: Payer: Medicare HMO | Admitting: Urology

## 2023-09-16 ENCOUNTER — Ambulatory Visit
Admission: RE | Admit: 2023-09-16 | Discharge: 2023-09-16 | Disposition: A | Discharge status: Home / Self Care | Source: Ambulatory Visit | Attending: Family Medicine | Admitting: Family Medicine

## 2023-09-16 DIAGNOSIS — N644 Mastodynia: Secondary | ICD-10-CM | POA: Diagnosis present

## 2024-08-02 ENCOUNTER — Other Ambulatory Visit: Payer: Self-pay | Admitting: Urology

## 2024-08-05 ENCOUNTER — Other Ambulatory Visit: Admitting: Urology

## 2024-08-09 ENCOUNTER — Other Ambulatory Visit: Admitting: Urology
